# Patient Record
Sex: Male | Born: 1965 | Race: Black or African American | Hispanic: No | Smoking: Never smoker
Health system: Southern US, Community
[De-identification: ages and names within clinical notes are randomized; demographics above are authoritative.]

## PROBLEM LIST (undated history)

## (undated) DIAGNOSIS — F329 Major depressive disorder, single episode, unspecified: Secondary | ICD-10-CM

## (undated) DIAGNOSIS — E559 Vitamin D deficiency, unspecified: Secondary | ICD-10-CM

## (undated) DIAGNOSIS — F429 Obsessive-compulsive disorder, unspecified: Secondary | ICD-10-CM

## (undated) DIAGNOSIS — E291 Testicular hypofunction: Secondary | ICD-10-CM

## (undated) DIAGNOSIS — F32A Depression, unspecified: Secondary | ICD-10-CM

## (undated) DIAGNOSIS — E119 Type 2 diabetes mellitus without complications: Secondary | ICD-10-CM

## (undated) HISTORY — DX: Depression, unspecified: F32.A

## (undated) HISTORY — DX: Vitamin D deficiency, unspecified: E55.9

## (undated) HISTORY — DX: Type 2 diabetes mellitus without complications: E11.9

## (undated) HISTORY — DX: Testicular hypofunction: E29.1

## (undated) HISTORY — DX: Major depressive disorder, single episode, unspecified: F32.9

## (undated) HISTORY — DX: Obsessive-compulsive disorder, unspecified: F42.9

---

## 1998-08-27 ENCOUNTER — Other Ambulatory Visit: Admission: RE | Admit: 1998-08-27 | Discharge: 1998-08-27 | Payer: Self-pay | Admitting: Orthopaedic Surgery

## 2004-04-14 ENCOUNTER — Ambulatory Visit (HOSPITAL_COMMUNITY): Admission: RE | Admit: 2004-04-14 | Discharge: 2004-04-14 | Payer: Self-pay | Admitting: *Deleted

## 2011-06-29 ENCOUNTER — Encounter: Payer: Self-pay | Admitting: *Deleted

## 2011-08-22 ENCOUNTER — Ambulatory Visit (INDEPENDENT_AMBULATORY_CARE_PROVIDER_SITE_OTHER): Payer: 59 | Admitting: Family Medicine

## 2011-08-22 VITALS — BP 135/72 | HR 67 | Temp 98.2°F | Resp 16 | Ht 73.5 in | Wt 280.2 lb

## 2011-08-22 DIAGNOSIS — M25561 Pain in right knee: Secondary | ICD-10-CM

## 2011-08-22 DIAGNOSIS — M7050 Other bursitis of knee, unspecified knee: Secondary | ICD-10-CM

## 2011-08-22 DIAGNOSIS — M76899 Other specified enthesopathies of unspecified lower limb, excluding foot: Secondary | ICD-10-CM

## 2011-08-22 DIAGNOSIS — M25569 Pain in unspecified knee: Secondary | ICD-10-CM

## 2011-08-22 NOTE — Patient Instructions (Signed)
Knee Pain The knee is the complex joint between your thigh and your lower leg. It is made up of bones, tendons, ligaments, and cartilage. The bones that make up the knee are:  The femur in the thigh.   The tibia and fibula in the lower leg.   The patella or kneecap riding in the groove on the lower femur.  CAUSES  Knee pain is a common complaint with many causes. A few of these causes are:  Injury, such as:   A ruptured ligament or tendon injury.   Torn cartilage.   Medical conditions, such as:   Gout   Arthritis   Infections   Overuse, over training or overdoing a physical activity.  Knee pain can be minor or severe. Knee pain can accompany debilitating injury. Minor knee problems often respond well to self-care measures or get well on their own. More serious injuries may need medical intervention or even surgery. SYMPTOMS The knee is complex. Symptoms of knee problems can vary widely. Some of the problems are:  Pain with movement and weight bearing.   Swelling and tenderness.   Buckling of the knee.   Inability to straighten or extend your knee.   Your knee locks and you cannot straighten it.   Warmth and redness with pain and fever.   Deformity or dislocation of the kneecap.  DIAGNOSIS  Determining what is wrong may be very straight forward such as when there is an injury. It can also be challenging because of the complexity of the knee. Tests to make a diagnosis may include:  Your caregiver taking a history and doing a physical exam.   Routine X-rays can be used to rule out other problems. X-rays will not reveal a cartilage tear. Some injuries of the knee can be diagnosed by:   Arthroscopy a surgical technique by which a small video camera is inserted through tiny incisions on the sides of the knee. This procedure is used to examine and repair internal knee joint problems. Tiny instruments can be used during arthroscopy to repair the torn knee cartilage  (meniscus).   Arthrography is a radiology technique. A contrast liquid is directly injected into the knee joint. Internal structures of the knee joint then become visible on X-ray film.   An MRI scan is a non x-ray radiology procedure in which magnetic fields and a computer produce two- or three-dimensional images of the inside of the knee. Cartilage tears are often visible using an MRI scanner. MRI scans have largely replaced arthrography in diagnosing cartilage tears of the knee.   Blood work.   Examination of the fluid that helps to lubricate the knee joint (synovial fluid). This is done by taking a sample out using a needle and a syringe.  TREATMENT The treatment of knee problems depends on the cause. Some of these treatments are:  Depending on the injury, proper casting, splinting, surgery or physical therapy care will be needed.   Give yourself adequate recovery time. Do not overuse your joints. If you begin to get sore during workout routines, back off. Slow down or do fewer repetitions.   For repetitive activities such as cycling or running, maintain your strength and nutrition.   Alternate muscle groups. For example if you are a weight lifter, work the upper body on one day and the lower body the next.   Either tight or weak muscles do not give the proper support for your knee. Tight or weak muscles do not absorb the stress placed   on the knee joint. Keep the muscles surrounding the knee strong.   Take care of mechanical problems.   If you have flat feet, orthotics or special shoes may help. See your caregiver if you need help.   Arch supports, sometimes with wedges on the inner or outer aspect of the heel, can help. These can shift pressure away from the side of the knee most bothered by osteoarthritis.   A brace called an "unloader" brace also may be used to help ease the pressure on the most arthritic side of the knee.   If your caregiver has prescribed crutches, braces,  wraps or ice, use as directed. The acronym for this is PRICE. This means protection, rest, ice, compression and elevation.   Nonsteroidal anti-inflammatory drugs (NSAID's), can help relieve pain. But if taken immediately after an injury, they may actually increase swelling. Take NSAID's with food in your stomach. Stop them if you develop stomach problems. Do not take these if you have a history of ulcers, stomach pain or bleeding from the bowel. Do not take without your caregiver's approval if you have problems with fluid retention, heart failure, or kidney problems.   For ongoing knee problems, physical therapy may be helpful.   Glucosamine and chondroitin are over-the-counter dietary supplements. Both may help relieve the pain of osteoarthritis in the knee. These medicines are different from the usual anti-inflammatory drugs. Glucosamine may decrease the rate of cartilage destruction.   Injections of a corticosteroid drug into your knee joint may help reduce the symptoms of an arthritis flare-up. They may provide pain relief that lasts a few months. You may have to wait a few months between injections. The injections do have a small increased risk of infection, water retention and elevated blood sugar levels.   Hyaluronic acid injected into damaged joints may ease pain and provide lubrication. These injections may work by reducing inflammation. A series of shots may give relief for as long as 6 months.   Topical painkillers. Applying certain ointments to your skin may help relieve the pain and stiffness of osteoarthritis. Ask your pharmacist for suggestions. Many over the-counter products are approved for temporary relief of arthritis pain.   In some countries, doctors often prescribe topical NSAID's for relief of chronic conditions such as arthritis and tendinitis. A review of treatment with NSAID creams found that they worked as well as oral medications but without the serious side effects.    PREVENTION  Maintain a healthy weight. Extra pounds put more strain on your joints.   Get strong, stay limber. Weak muscles are a common cause of knee injuries. Stretching is important. Include flexibility exercises in your workouts.   Be smart about exercise. If you have osteoarthritis, chronic knee pain or recurring injuries, you may need to change the way you exercise. This does not mean you have to stop being active. If your knees ache after jogging or playing basketball, consider switching to swimming, water aerobics or other low-impact activities, at least for a few days a week. Sometimes limiting high-impact activities will provide relief.   Make sure your shoes fit well. Choose footwear that is right for your sport.   Protect your knees. Use the proper gear for knee-sensitive activities. Use kneepads when playing volleyball or laying carpet. Buckle your seat belt every time you drive. Most shattered kneecaps occur in car accidents.   Rest when you are tired.  SEEK MEDICAL CARE IF:  You have knee pain that is continual and does not   seem to be getting better.  SEEK IMMEDIATE MEDICAL CARE IF:  Your knee joint feels hot to the touch and you have a high fever. MAKE SURE YOU:   Understand these instructions.   Will watch your condition.   Will get help right away if you are not doing well or get worse.  Document Released: 01/02/2007 Document Revised: 02/24/2011 Document Reviewed: 01/02/2007 [employer] Patient Information 2012 [employer]. 

## 2011-08-22 NOTE — Progress Notes (Deleted)
  Subjective:    Patient ID: Tyrone Holloway, male    DOB: 10-Aug-1965, 46 y.o.   MRN: [id number]  HPI    Review of Systems     Objective:   Physical Exam        Assessment & Plan:

## 2011-08-22 NOTE — Progress Notes (Signed)
  Subjective:    Patient ID: Tyrone Holloway, male    DOB: 09/24/1965, 46 y.o.   MRN: [id number]  HPI R knee pain x 1 week Prior hx/o recurrent knee bursitis s/p bursectomy with [facility] 7 years ago per pt.  Has had minimal knee pain since this point apart from a few flares.  Works on [employer] daily.  Has had R knee pain and swelling.  No direct trauma per pt. No knee locking or giving away.  Swelling predominantly in anterior knee.   Review of Systems See HPI, otherwise ROS negative    Objective:   Physical Exam Gen: in bed, NAD MSK: Knee: Normal to inspection with no erythema or obvious bony abnormalities. + mild anterior knee effusion Palpation normal with no warmth or joint line tenderness or patellar tenderness or condyle tenderness. ROM normal in flexion and extension and lower leg rotation. Ligaments with solid consistent endpoints including ACL, PCL, LCL, MCL. Negative Mcmurray's and provocative meniscal tests. Non painful patellar compression. Patellar and quadriceps tendons unremarkable. Hamstring and quadriceps strength is normal.        Assessment & Plan:  Knee pain:   After consent was obtained, using sterile technique the R knee was prepped, and  via the medial infrapatellar approach, joint aspiration was attempted, however was unable to aspirate any fluid from joint space. 40mg  of kenalog and 4 ccs of 1% Lidocaine was then injected and the needle withdrawn.  The procedure was well tolerated.   The patient is asked to continue to rest the knee for a few more days before resuming regular activities.  It may be more painful for the first 1-2 days.  Watch for fever, or increased swelling or persistent pain in knee. Call or return to clinic prn if such symptoms occur or the knee fails to improve as anticipated.     The patient and/or caregiver has been counseled thoroughly with regard to treatment plan and/or medications prescribed including dosage, schedule,  interactions, rationale for use, and possible side effects and they verbalize understanding. Diagnoses and expected course of recovery discussed and will return if not improved as expected or if the condition worsens. Patient and/or caregiver verbalized understanding.     Addendum-I agree with the exam and plan for this chronic osteoarthritis with effusion and pain/we will refer him back to [facility] for followup evaluation Unless he responds dramatically to this injection. [name]

## 2012-02-23 ENCOUNTER — Telehealth: Payer: Self-pay

## 2012-02-23 NOTE — Telephone Encounter (Signed)
PT STATES HE [facility] PLEASE CALL [phone] OR [phone] PT WAS TOLD IT COULD TAKE UP TO 72 HRS IF NOT MORE

## 2012-02-24 NOTE — Telephone Encounter (Signed)
Records ready for pickup. Patient notified. °

## 2012-03-16 ENCOUNTER — Ambulatory Visit (INDEPENDENT_AMBULATORY_CARE_PROVIDER_SITE_OTHER): Payer: 59 | Admitting: Emergency Medicine

## 2012-03-16 VITALS — BP 140/77 | HR 75 | Temp 97.6°F | Resp 16 | Ht 73.5 in | Wt 295.0 lb

## 2012-03-16 DIAGNOSIS — Z0000 Encounter for general adult medical examination without abnormal findings: Secondary | ICD-10-CM

## 2012-03-16 DIAGNOSIS — Z113 Encounter for screening for infections with a predominantly sexual mode of transmission: Secondary | ICD-10-CM

## 2012-03-16 LAB — POCT URINALYSIS DIPSTICK
Bilirubin, UA: NEGATIVE
Blood, UA: NEGATIVE
Glucose, UA: NEGATIVE
Leukocytes, UA: NEGATIVE
Nitrite, UA: NEGATIVE
Urobilinogen, UA: 0.2

## 2012-03-16 LAB — COMPREHENSIVE METABOLIC PANEL
ALT: 33 U/L (ref 0–53)
AST: 19 U/L (ref 0–37)
Albumin: 4.3 g/dL (ref 3.5–5.2)
Alkaline Phosphatase: 45 U/L (ref 39–117)
BUN: 11 mg/dL (ref 6–23)
Calcium: 9.3 mg/dL (ref 8.4–10.5)
Chloride: 105 mEq/L (ref 96–112)
Creat: 0.94 mg/dL (ref 0.50–1.35)
Potassium: 4.4 mEq/L (ref 3.5–5.3)

## 2012-03-16 LAB — POCT UA - MICROSCOPIC ONLY
Casts, Ur, LPF, POC: NEGATIVE
Yeast, UA: NEGATIVE

## 2012-03-16 LAB — POCT CBC
Lymph, poc: 2.3 (ref 0.6–3.4)
MCH, POC: 26.8 pg — AB (ref 27–31.2)
MCHC: 30.8 g/dL — AB (ref 31.8–35.4)
MID (cbc): 0.4 (ref 0–0.9)
MPV: 8.1 fL (ref 0–99.8)
POC Granulocyte: 2.6 (ref 2–6.9)
POC LYMPH PERCENT: 43.6 %L (ref 10–50)
POC MID %: 8.2 %M (ref 0–12)
Platelet Count, POC: 410 10*3/uL (ref 142–424)
RDW, POC: 14.4 %
WBC: 5.3 10*3/uL (ref 4.6–10.2)

## 2012-03-16 LAB — LIPID PANEL
HDL: 32 mg/dL — ABNORMAL LOW (ref >39)
LDL Cholesterol: 143 mg/dL — ABNORMAL HIGH (ref 0–99)
Total CHOL/HDL Ratio: 6.1 Ratio

## 2012-03-16 LAB — TSH: TSH: 1.653 u[IU]/mL (ref 0.350–4.500)

## 2012-03-16 LAB — POCT GLYCOSYLATED HEMOGLOBIN (HGB A1C): Hemoglobin A1C: 6.5

## 2012-03-16 NOTE — Progress Notes (Signed)
[facility] [address] [place] [address] [phone]  Date:  03/16/2012   Name:  Tyrone Holloway   DOB:  Aug 04, 1965   MRN:  [id number]  PCP:  No primary provider on file.    Chief Complaint: Annual Exam   History of Present Illness:  Tyrone Holloway is a 46 y.o. very pleasant male patient who presents with the following:  For wellness examination.  No acute medical concerns.  Had a flu shot at [facility]  Diagnosed with "borderline diabetes" but not taking medication.  Has chronic low back pain.  Not currently on medications. Scheduled for colonoscopy in March at [facility].    There is no problem list on file for this patient.   Past Medical History  Diagnosis Date  . Depression   . OCD (obsessive compulsive disorder)     No past surgical history on file.  History  Substance Use Topics  . Smoking status: Never Smoker   . Smokeless tobacco: Not on file  . Alcohol Use: No    Family History  Problem Relation Age of Onset  . Hypertension Mother   . Hyperlipidemia Mother   . Hypertension Father   . Diabetes Father   . Hypertension Brother   . Diabetes Brother   . Cancer Maternal Grandfather     No Known Allergies  Medication list has been reviewed and updated.  Current Outpatient Prescriptions on File Prior to Visit  Medication Sig Dispense Refill  . HYDROcodone-acetaminophen (NORCO) 10-325 MG per tablet Take 1 tablet by mouth every 6 (six) hours as needed.        Review of Systems:  As per HPI, otherwise negative.    Physical Examination: Filed Vitals:   03/16/12 1417  BP: 140/77  Pulse: 75  Temp: 97.6 F (36.4 C)  Resp: 16   Filed Vitals:   03/16/12 1417  Height: 6' 1.5" (1.867 m)  Weight: 295 lb (133.811 kg)   Body mass index is 38.39 kg/(m^2). Ideal Body Weight: Weight in (lb) to have BMI = 25: 191.7   GEN: WDWN, NAD, Non-toxic, A & O x 3  No rash or icterus HEENT: Atraumatic, Normocephalic. Neck supple. No  masses, No LAD. Oropharynx negative Ears and Nose: No external deformity.  TM negative. CV: RRR, No M/G/R. No JVD. No thrill. No extra heart sounds. PULM: CTA B, no wheezes, crackles, rhonchi. No retractions. No resp. distress. No accessory muscle use. Back:  Large midback sebaceous cyst ABD: S, NT, ND, +BS. No rebound. No HSM.  Nonpigmented striae EXTR: No c/c/e NEURO Normal gait.  PSYCH: Normally interactive. Conversant. Not depressed or anxious appearing.  Calm demeanor.  RECTAL:  Patient declines as he is having a colonoscopy in March.  Assessment and Plan: Wellness exam Obesity NIDDM Labs pending Follow up based on labs  Carmelina Dane, MD

## 2012-03-17 LAB — VITAMIN D 25 HYDROXY (VIT D DEFICIENCY, FRACTURES): Vit D, 25-Hydroxy: 27 ng/mL — ABNORMAL LOW (ref 30–89)

## 2012-03-17 NOTE — Progress Notes (Signed)
Reviewed and agree.

## 2012-03-19 ENCOUNTER — Telehealth: Payer: Self-pay

## 2012-03-19 LAB — GC/CHLAMYDIA PROBE AMP, URINE: GC Probe Amp, Urine: NEGATIVE

## 2012-03-19 NOTE — Telephone Encounter (Signed)
Pt would like to have a copy of his lab work that was done during his last visit here. Best# [phone]

## 2012-03-20 NOTE — Telephone Encounter (Signed)
patient came by and picked up

## 2012-03-20 NOTE — Telephone Encounter (Signed)
Called patient, does he want it mailed? Or does he want to pick this up?

## 2012-04-12 ENCOUNTER — Ambulatory Visit (INDEPENDENT_AMBULATORY_CARE_PROVIDER_SITE_OTHER): Payer: 59 | Admitting: Family Medicine

## 2012-04-12 VITALS — BP 139/80 | HR 102 | Temp 98.5°F | Resp 20 | Ht 73.5 in | Wt 288.2 lb

## 2012-04-12 DIAGNOSIS — R110 Nausea: Secondary | ICD-10-CM

## 2012-04-12 DIAGNOSIS — R197 Diarrhea, unspecified: Secondary | ICD-10-CM

## 2012-04-12 DIAGNOSIS — R7302 Impaired glucose tolerance (oral): Secondary | ICD-10-CM

## 2012-04-12 DIAGNOSIS — R112 Nausea with vomiting, unspecified: Secondary | ICD-10-CM

## 2012-04-12 DIAGNOSIS — R5383 Other fatigue: Secondary | ICD-10-CM

## 2012-04-12 LAB — POCT CBC
HCT, POC: 49.2 % (ref 43.5–53.7)
Hemoglobin: 15.5 g/dL (ref 14.1–18.1)
Lymph, poc: 0.6 (ref 0.6–3.4)
MCH, POC: 27.1 pg (ref 27–31.2)
MCHC: 31.5 g/dL — AB (ref 31.8–35.4)
POC LYMPH PERCENT: 7.5 %L — AB (ref 10–50)
RDW, POC: 14.2 %
WBC: 8.5 10*3/uL (ref 4.6–10.2)

## 2012-04-12 MED ORDER — ONDANSETRON HCL 8 MG PO TABS: 8.0000 mg | ORAL_TABLET | Freq: Three times a day (TID) | ORAL | Status: DC | PRN

## 2012-04-12 MED ORDER — ONDANSETRON 4 MG PO TBDP: 8.0000 mg | ORAL_TABLET | Freq: Once | ORAL | Status: DC

## 2012-04-12 NOTE — Patient Instructions (Addendum)
It seems that you have a stomach virus, or perhaps are having a reaction to the salad that you ate.  Rest and drink plenty of fluids.  If you are not feeling better by tomorrow or are getting worse, or if you develop any abdominal pain please let us know.

## 2012-04-12 NOTE — Progress Notes (Signed)
[facility] [address] [place] [address] [phone]  Date:  04/12/2012   Name:  Tyrone Holloway   DOB:  Dec 04, 1965   MRN:  [id number]  PCP:  No primary provider on file.    Chief Complaint: Dizziness, Diarrhea and Fatigue   History of Present Illness:  Tyrone Holloway is a 47 y.o. very pleasant male patient who presents with the following:  He is here today with illness.  He feels lightheaded, weak, "stomach is sour," and he has had an episode of diarrhea this am followed by a couple of solid stools.   No vomiting but he has felt nauseated.  He did eat a banana and a yogurt this am.   He does not have any abdominal pain.  He ate a salad at [employer] last night.  The cheese tasted bad to him but he ate it anyway.    He is generally healthy.   There is no problem list on file for this patient.   Past Medical History  Diagnosis Date  . Depression   . OCD (obsessive compulsive disorder)     No past surgical history on file.  History  Substance Use Topics  . Smoking status: Never Smoker   . Smokeless tobacco: Not on file  . Alcohol Use: No    Family History  Problem Relation Age of Onset  . Hypertension Mother   . Hyperlipidemia Mother   . Hypertension Father   . Diabetes Father   . Hypertension Brother   . Diabetes Brother   . Cancer Maternal Grandfather     No Known Allergies  Medication list has been reviewed and updated.  Current Outpatient Prescriptions on File Prior to Visit  Medication Sig Dispense Refill  . HYDROcodone-acetaminophen (NORCO) 10-325 MG per tablet Take 1 tablet by mouth every 6 (six) hours as needed.        Review of Systems:  As per HPI- otherwise negative.   Physical Examination: Filed Vitals:   04/12/12 1514  BP: 139/80  Pulse: 102  Temp: 98.5 F (36.9 C)  Resp: 20   Filed Vitals:   04/12/12 1514  Height: 6' 1.5" (1.867 m)  Weight: 288 lb 3.2 oz (130.727 kg)   Body mass index is  37.51 kg/(m^2). Ideal Body Weight: Weight in (lb) to have BMI = 25: 191.7   GEN: WDWN, NAD, Non-toxic, A & O x 3, obese HEENT: Atraumatic, Normocephalic. Neck supple. No masses, No LAD. Bilateral TM wnl, oropharynx normal.  PEERL,EOMI.   Ears and Nose: No external deformity. CV: RRR, No M/G/R. No JVD. No thrill. No extra heart sounds. PULM: CTA B, no wheezes, crackles, rhonchi. No retractions. No resp. distress. No accessory muscle use. ABD: S, NT, ND, +BS. No rebound. No HSM. EXTR: No c/c/e NEURO Normal gait.  PSYCH: Normally interactive. Conversant. Not depressed or anxious appearing.  Calm demeanor.   Results for orders placed in visit on 04/12/12  POCT CBC      Component Value Range   WBC 8.5  4.6 - 10.2 K/uL   Lymph, poc 0.6  0.6 - 3.4   POC LYMPH PERCENT 7.5 (*) 10 - 50 %L   MID (cbc) 0.3  0 - 0.9   POC MID % 3.4  0 - 12 %M   POC Granulocyte 7.6 (*) 2 - 6.9   Granulocyte percent 89.1 (*) 37 - 80 %G   RBC 5.71  4.69 - 6.13 M/uL   Hemoglobin  15.5  14.1 - 18.1 g/dL   HCT, POC 16.1  09.6 - 53.7 %   MCV 86.2  80 - 97 fL   MCH, POC 27.1  27 - 31.2 pg   MCHC 31.5 (*) 31.8 - 35.4 g/dL   RDW, POC 04.5     Platelet Count, POC 404  142 - 424 K/uL   MPV 7.6  0 - 99.8 fL  GLUCOSE, POCT (MANUAL RESULT ENTRY)      Component Value Range   POC Glucose 129 (*) 70 - 99 mg/dl  POCT INFLUENZA A/B      Component Value Range   Influenza A, POC Negative     Influenza B, POC Negative     Given 8mg  of zofran ODT.  He felt better and drank gatorade, ate crackers.    Assessment and Plan: 1. Diarrhea  POCT CBC  2. Nausea  ondansetron (ZOFRAN-ODT) disintegrating tablet 8 mg, ondansetron (ZOFRAN) 8 MG tablet  3. Impaired glucose tolerance  POCT glucose (manual entry)  4. Fatigue  POCT Influenza A/B   Renly likely has a viral gastroenteritis vs reaction to eating a spoiled food.   He will rest and use zofran as needed.  cautioned him regarding he need for close follow-up if not better or if  getting worse, especially if he does develop abdominal pain Jodie Cavey, MD

## 2013-03-18 ENCOUNTER — Ambulatory Visit (INDEPENDENT_AMBULATORY_CARE_PROVIDER_SITE_OTHER): Payer: 59 | Admitting: Physician Assistant

## 2013-03-18 VITALS — BP 132/78 | HR 71 | Temp 98.7°F | Resp 14 | Ht 74.0 in | Wt 293.0 lb

## 2013-03-18 DIAGNOSIS — Z0000 Encounter for general adult medical examination without abnormal findings: Secondary | ICD-10-CM

## 2013-03-18 DIAGNOSIS — Z7251 High risk heterosexual behavior: Secondary | ICD-10-CM

## 2013-03-18 DIAGNOSIS — R5381 Other malaise: Secondary | ICD-10-CM

## 2013-03-18 DIAGNOSIS — E119 Type 2 diabetes mellitus without complications: Secondary | ICD-10-CM

## 2013-03-18 LAB — POCT URINALYSIS DIPSTICK
Bilirubin, UA: NEGATIVE
Blood, UA: NEGATIVE
Nitrite, UA: NEGATIVE
Spec Grav, UA: 1.015
Urobilinogen, UA: 0.2
pH, UA: 5.5

## 2013-03-18 LAB — POCT UA - MICROSCOPIC ONLY
Bacteria, U Microscopic: NEGATIVE
Casts, Ur, LPF, POC: NEGATIVE
Crystals, Ur, HPF, POC: NEGATIVE
RBC, urine, microscopic: NEGATIVE
Yeast, UA: NEGATIVE

## 2013-03-18 LAB — POCT CBC
Hemoglobin: 12.6 g/dL — AB (ref 14.1–18.1)
Lymph, poc: 1.8 (ref 0.6–3.4)
MCH, POC: 27.2 pg (ref 27–31.2)
MCHC: 31 g/dL — AB (ref 31.8–35.4)
MID (cbc): 0.4 (ref 0–0.9)
MPV: 7.9 fL (ref 0–99.8)
POC Granulocyte: 3.1 (ref 2–6.9)
POC LYMPH PERCENT: 33.5 %L (ref 10–50)
POC MID %: 7.1 %M (ref 0–12)
Platelet Count, POC: 303 10*3/uL (ref 142–424)
RDW, POC: 14.2 %
WBC: 5.3 10*3/uL (ref 4.6–10.2)

## 2013-03-18 MED ORDER — GLUCOSE BLOOD VI STRP: ORAL_STRIP | Status: DC

## 2013-03-18 MED ORDER — METFORMIN HCL 500 MG PO TABS: 500.0000 mg | ORAL_TABLET | Freq: Two times a day (BID) | ORAL | Status: DC

## 2013-03-18 MED ORDER — FREESTYLE LANCETS MISC
Status: DC
Start: 2013-03-18 — End: 2013-03-19

## 2013-03-18 NOTE — Progress Notes (Signed)
Patient ID: Tyrone Holloway MRN: [id number], DOB: [birth year] 47 y.o. Date of Encounter: 03/18/2013, 5:08 PM  Primary Physician: No primary provider on file.  Chief Complaint: Physical (CPE)  HPI: 47 y.o. male with history noted below here for CPE. Doing well. Last physical was Nov-Dec 2013.   1) STD check: He would like to be checked for STD's. States he recently had unprotected sex with a new male partner within the past several days. He has had unprotected intercourse with 3-4 females within the past 12 months. He denies any dysuria, discharge, lesions, blisters, or sores.   2) Diabetes mellitus: He was started on metformin about a year ago. He is uncertain of the dosage. However, he has been out of the medication since May 2014. He was tolerating the medication without adverse effects. This was written for him by Dr. Milinda Cave. He does not remember what his blood sugar or A1C were at their office. He has not checked his blood sugar at home recently because he is out of test strips. He diet consists of some easy foods and juices. He would like to start working out and lose some weight. He knows this would help. He drinks a lot of water throughout the day. Because of this he is up urinating frequently.   3) CPE: He was in the [place] [employer] in the early 1990's. He gets some of his healthcare through the [facility] medical system. He received a screening colonoscopy 2 years ago secondary to family history, his maternal grandfather had colon cancer. He reports this screening as normal. I do not have the report in front of me tonight. He does not know when he was advised to return for follow up. He states "they will contact me." He is asymptomatic. His maternal uncle had prostate cancer. He has under gone prostate/DRE and PSA screens since his 30's. He reports being allergic to "something" but no one has been able to determine the substance. He has undergone allergy testing and this was inconclusive. He would  like to have his testosterone checked. He states this has been low in the past and would lust like to have it checked again.   Review of Systems: Consitutional: Positive for fatigue. No fever, chills, night sweats, lymphadenopathy, or weight changes. Eyes: No visual changes, eye redness, or discharge. ENT/Mouth: Ears: No otalgia, tinnitus, hearing loss, discharge. Nose: No congestion, rhinorrhea, sinus pain, or epistaxis. Throat: No sore throat, post nasal drip, or teeth pain. Cardiovascular: No CP, palpitations, diaphoresis, DOE, edema, orthopnea, PND. Respiratory: No cough, hemoptysis, SOB, or wheezing. Gastrointestinal: No anorexia, dysphagia, reflux, pain, nausea, vomiting, hematemesis, diarrhea, constipation, BRBPR, or melena. Genitourinary: Positive for frequency. No dysuria, urgency, hematuria, incontinence, nocturia, decreased urinary stream, discharge, impotence, or testicular pain/masses. Musculoskeletal: No decreased ROM, myalgias, stiffness, joint swelling, or weakness. Skin: No rash, erythema, lesion changes, pain, warmth, jaundice, or pruritis. Neurological: No headache, dizziness, syncope, seizures, tremors, memory loss, coordination problems, or paresthesias. Psychological: No anxiety, depression, hallucinations, SI/HI. Endocrine: See above.   Past Medical History  Diagnosis Date  . Depression   . OCD (obsessive compulsive disorder)      History reviewed. No pertinent past surgical history.  Home Meds:  Prior to Admission medications   Not on File    Allergies: No Known Allergies  History   Social History  . Marital Status: Divorced    Spouse Name: N/A    Number of Children: N/A  . Years of Education: N/A   Occupational History  .  Not on file.   Social History Main Topics  . Smoking status: Never Smoker   . Smokeless tobacco: Not on file  . Alcohol Use: No  . Drug Use: No  . Sexual Activity: No   Other Topics Concern  . Not on file   Social History  Narrative  . No narrative on file    Family History  Problem Relation Age of Onset  . Hypertension Mother   . Hyperlipidemia Mother   . Hypertension Father   . Diabetes Father   . Hypertension Brother   . Diabetes Brother   . Cancer Maternal Grandfather     Physical Exam: Blood pressure 132/78, pulse 71, temperature 98.7 F (37.1 C), resp. rate 14, height 6\' 2"  (1.88 m), weight 293 lb (132.904 kg), SpO2 100.00%.  General: Well developed, well nourished, in no acute distress. HEENT: Normocephalic, atraumatic. Conjunctiva pink, sclera non-icteric. Pupils 2 mm constricting to 1 mm, round, regular, and equally reactive to light and accomodation. EOMI. Internal auditory canal clear. TMs with good cone of light and without pathology. Nasal mucosa pink. Nares are without discharge. No sinus tenderness. Oral mucosa pink. Dentition normal. Pharynx without exudate.   Neck: Supple. Trachea midline. No thyromegaly. Full ROM. No lymphadenopathy. Lungs: Clear to auscultation bilaterally without wheezes, rales, or rhonchi. Breathing is of normal effort and unlabored. Cardiovascular: RRR with S1 S2. No murmurs, rubs, or gallops appreciated. Distal pulses 2+ symmetrically. No carotid or abdominal bruits. Abdomen: Soft, non-tender, non-distended with normoactive bowel sounds. No hepatosplenomegaly or masses. No rebound/guarding. No CVA tenderness. Without hernias.  Rectal: No external hemorrhoids or fissures. Rectal vault without masses. Prostate not enlarged, smooth, symmetrical, without nodules, or TTP.  Genitourinary: Uncircumcised male. No penile lesions. Testes descended bilaterally, and smooth without tenderness or masses.  Musculoskeletal: Full range of motion and 5/5 strength throughout. Without swelling, atrophy, tenderness, crepitus, or warmth. Extremities without clubbing, cyanosis, or edema. Calves supple. Skin: Warm and moist without erythema, ecchymosis, wounds, or rash. Neuro: A+Ox3. CN  II-XII grossly intact. Moves all extremities spontaneously. Full sensation throughout. Normal gait. DTR 2+ throughout upper and lower extremities. Finger to nose intact. Psych:  Responds to questions appropriately with a normal affect.   Studies:  Results for orders placed in visit on 03/18/13  POCT CBC      Result Value Range   WBC 5.3  4.6 - 10.2 K/uL   Lymph, poc 1.8  0.6 - 3.4   POC LYMPH PERCENT 33.5  10 - 50 %L   MID (cbc) 0.4  0 - 0.9   POC MID % 7.1  0 - 12 %M   POC Granulocyte 3.1  2 - 6.9   Granulocyte percent 59.4  37 - 80 %G   RBC 4.64 (*) 4.69 - 6.13 M/uL   Hemoglobin 12.6 (*) 14.1 - 18.1 g/dL   HCT, POC 16.1 (*) 09.6 - 53.7 %   MCV 87.6  80 - 97 fL   MCH, POC 27.2  27 - 31.2 pg   MCHC 31.0 (*) 31.8 - 35.4 g/dL   RDW, POC 04.5     Platelet Count, POC 303  142 - 424 K/uL   MPV 7.9  0 - 99.8 fL  GLUCOSE, POCT (MANUAL RESULT ENTRY)      Result Value Range   POC Glucose 93  70 - 99 mg/dl  POCT GLYCOSYLATED HEMOGLOBIN (HGB A1C)      Result Value Range   Hemoglobin A1C 6.2    POCT URINALYSIS  DIPSTICK      Result Value Range   Color, UA yellow     Clarity, UA clear     Glucose, UA neg     Bilirubin, UA neg     Ketones, UA neg     Spec Grav, UA 1.015     Blood, UA neg     pH, UA 5.5     Protein, UA neg     Urobilinogen, UA 0.2     Nitrite, UA neg     Leukocytes, UA Negative    POCT UA - MICROSCOPIC ONLY      Result Value Range   WBC, Ur, HPF, POC neg     RBC, urine, microscopic neg     Bacteria, U Microscopic neg     Mucus, UA neg     Epithelial cells, urine per micros 0-1     Crystals, Ur, HPF, POC neg     Casts, Ur, LPF, POC neg     Yeast, UA neg      CBC, CMET, Lipid, PSA, TSH all pending. Patient is not fasting. Future order for testosterone placed.   Assessment/Plan:  47 y.o. male here for CPE with high risk sex, diabetes mellitus, and newly found anemia.   1) Anemia -Recheck in 3 months -Healthy diet and exercise  2) Diabetes  mellitus -Restart metformin 500 mg 1 po bid #60 RF 2 -Healthy diet and exercise -Weight loss -See ophthalmologist -See DDS -Recheck 3 months  3) High risk sex -Use condoms -Safe sex practices -Await labs, treat if needed  4) CPE -Healthy diet and exercise -Weight loss -Age appropriate anticipatory guidance   Signed, Eula Listen, PA-C [facility] [place], [place] [address] [phone] 03/18/2013 5:08 PM

## 2013-03-19 ENCOUNTER — Telehealth: Payer: Self-pay

## 2013-03-19 DIAGNOSIS — E119 Type 2 diabetes mellitus without complications: Secondary | ICD-10-CM

## 2013-03-19 LAB — COMPREHENSIVE METABOLIC PANEL
ALT: 29 U/L (ref 0–53)
Albumin: 4.5 g/dL (ref 3.5–5.2)
BUN: 10 mg/dL (ref 6–23)
CO2: 25 mEq/L (ref 19–32)
Calcium: 9.7 mg/dL (ref 8.4–10.5)
Chloride: 103 mEq/L (ref 96–112)
Glucose, Bld: 100 mg/dL — ABNORMAL HIGH (ref 70–99)
Potassium: 4.3 mEq/L (ref 3.5–5.3)
Total Bilirubin: 0.4 mg/dL (ref 0.3–1.2)
Total Protein: 7.5 g/dL (ref 6.0–8.3)

## 2013-03-19 LAB — RPR

## 2013-03-19 LAB — TSH: TSH: 2.214 u[IU]/mL (ref 0.350–4.500)

## 2013-03-19 LAB — LIPID PANEL
Cholesterol: 220 mg/dL — ABNORMAL HIGH (ref 0–200)
LDL Cholesterol: 152 mg/dL — ABNORMAL HIGH (ref 0–99)
Triglycerides: 166 mg/dL — ABNORMAL HIGH (ref <150)

## 2013-03-19 LAB — HIV ANTIBODY (ROUTINE TESTING W REFLEX): HIV: NONREACTIVE

## 2013-03-19 MED ORDER — GLUCOSE BLOOD VI STRP: ORAL_STRIP | Status: DC

## 2013-03-19 MED ORDER — FREESTYLE LANCETS MISC: Status: AC

## 2013-03-19 NOTE — Telephone Encounter (Signed)
Faxed req for specific instr's for pt's lancets. Sig can not be "use as directed". Re-sending in for daily testing since pt is not on insulin.

## 2013-03-20 ENCOUNTER — Other Ambulatory Visit: Payer: Self-pay | Admitting: Physician Assistant

## 2013-03-20 DIAGNOSIS — E785 Hyperlipidemia, unspecified: Secondary | ICD-10-CM

## 2013-03-20 LAB — GC/CHLAMYDIA PROBE AMP
CT Probe RNA: NEGATIVE
GC Probe RNA: NEGATIVE

## 2013-03-20 LAB — HSV(HERPES SIMPLEX VRS) I + II AB-IGG: HSV 1 Glycoprotein G Ab, IgG: <0.1 IV

## 2013-03-20 MED ORDER — ATORVASTATIN CALCIUM 20 MG PO TABS: 20.0000 mg | ORAL_TABLET | Freq: Every day | ORAL | Status: DC

## 2013-03-22 ENCOUNTER — Encounter: Payer: Self-pay | Admitting: Radiology

## 2013-04-01 ENCOUNTER — Encounter: Payer: Self-pay | Admitting: *Deleted

## 2013-04-01 DIAGNOSIS — E559 Vitamin D deficiency, unspecified: Secondary | ICD-10-CM | POA: Insufficient documentation

## 2013-04-03 ENCOUNTER — Ambulatory Visit: Payer: Self-pay | Admitting: Emergency Medicine

## 2013-04-15 ENCOUNTER — Ambulatory Visit: Payer: 59 | Admitting: Physician Assistant

## 2013-04-24 ENCOUNTER — Ambulatory Visit: Payer: Self-pay | Admitting: Emergency Medicine

## 2013-10-30 ENCOUNTER — Ambulatory Visit (INDEPENDENT_AMBULATORY_CARE_PROVIDER_SITE_OTHER): Payer: 59 | Admitting: Physician Assistant

## 2013-10-30 ENCOUNTER — Encounter: Payer: Self-pay | Admitting: Internal Medicine

## 2013-10-30 VITALS — BP 118/80 | HR 64 | Temp 98.2°F | Resp 16 | Ht 74.5 in | Wt 307.4 lb

## 2013-10-30 DIAGNOSIS — E559 Vitamin D deficiency, unspecified: Secondary | ICD-10-CM

## 2013-10-30 DIAGNOSIS — N529 Male erectile dysfunction, unspecified: Secondary | ICD-10-CM

## 2013-10-30 DIAGNOSIS — E291 Testicular hypofunction: Secondary | ICD-10-CM

## 2013-10-30 DIAGNOSIS — E538 Deficiency of other specified B group vitamins: Secondary | ICD-10-CM

## 2013-10-30 DIAGNOSIS — E782 Mixed hyperlipidemia: Secondary | ICD-10-CM | POA: Insufficient documentation

## 2013-10-30 DIAGNOSIS — E785 Hyperlipidemia, unspecified: Secondary | ICD-10-CM

## 2013-10-30 DIAGNOSIS — E669 Obesity, unspecified: Secondary | ICD-10-CM

## 2013-10-30 DIAGNOSIS — E119 Type 2 diabetes mellitus without complications: Secondary | ICD-10-CM

## 2013-10-30 DIAGNOSIS — Z79899 Other long term (current) drug therapy: Secondary | ICD-10-CM

## 2013-10-30 LAB — CBC WITH DIFFERENTIAL/PLATELET
Basophils Absolute: 0 10*3/uL (ref 0.0–0.1)
Basophils Relative: 0 % (ref 0–1)
EOS ABS: 0.2 10*3/uL (ref 0.0–0.7)
EOS PCT: 3 % (ref 0–5)
HEMATOCRIT: 40.2 % (ref 39.0–52.0)
HEMOGLOBIN: 13.9 g/dL (ref 13.0–17.0)
Lymphocytes Relative: 31 % (ref 12–46)
Lymphs Abs: 1.7 10*3/uL (ref 0.7–4.0)
MCH: 27.4 pg (ref 26.0–34.0)
MCHC: 34.6 g/dL (ref 30.0–36.0)
MCV: 79.3 fL (ref 78.0–100.0)
MONOS PCT: 8 % (ref 3–12)
Monocytes Absolute: 0.4 10*3/uL (ref 0.1–1.0)
Neutro Abs: 3.2 10*3/uL (ref 1.7–7.7)
Neutrophils Relative %: 58 % (ref 43–77)
Platelets: 255 10*3/uL (ref 150–400)
RBC: 5.07 MIL/uL (ref 4.22–5.81)
RDW: 15.5 % (ref 11.5–15.5)
WBC: 5.6 10*3/uL (ref 4.0–10.5)

## 2013-10-30 LAB — HEMOGLOBIN A1C
Hgb A1c MFr Bld: 7.1 % — ABNORMAL HIGH (ref <5.7)
Mean Plasma Glucose: 157 mg/dL — ABNORMAL HIGH (ref <117)

## 2013-10-30 MED ORDER — GLUCOSE BLOOD VI STRP: ORAL_STRIP | Status: AC

## 2013-10-30 NOTE — Progress Notes (Signed)
Assessment and Plan:  Hypertension: Continue medication, monitor blood pressure at home. Continue DASH diet. Cholesterol: Continue diet and exercise. Check cholesterol.  Diabetes-Continue diet and exercise. Check A1C. Discussed general issues about diabetes pathophysiology and management., Educational material distributed., Suggested low cholesterol diet., Encouraged aerobic exercise., Discussed foot care., Reminded to get yearly retinal exam. Vitamin D Def- check level and continue medications.  Fatigue/ED-? Low T versus sleepapnea versus DM- suggest sleep study, he will get at [facility] hospital, will chck testosterone.  Poor insight/noncompliance- very long conversation about diease process, and need to keep appointments, will need 3 month and explained likely with labs will need 1 month apointment  Continue diet and meds as discussed. Further disposition pending results of labs. Discussed med's effects and SE's.  OVER 40 minutes of exam, counseling, chart review, referral performed   HPI 48 y.o. male  presents for follow up after close to a year of not being seen, with hypertension, hyperlipidemia, diabetes and vitamin D. His blood pressure has been controlled at home, today their BP is BP: 118/80 mmHg He does workout, sporadicly and has not been to the gym in several week.  He denies chest pain, shortness of breath, dizziness.  He is not on cholesterol medication and denies myalgias. His cholesterol is not at goal. The cholesterol last visit was:   Lab Results  Component Value Date   CHOL 220* 03/18/2013   HDL 35* 03/18/2013   LDLCALC 152* 03/18/2013   TRIG 166* 03/18/2013   CHOLHDL 6.3 03/18/2013   He has not been working on diet and exercise for Diabetes, he is not on medications, and does not test his sugars, and denies paresthesia of the feet, polydipsia and polyuria. Last A1C in the office was:  Lab Results  Component Value Date   HGBA1C 6.2 03/18/2013   Patient is on Vitamin D  supplement. Lab Results  Component Value Date   VD25OH 49* 03/16/2012     He states that he has a history of low T and has been having fatigue, ED. And would like to get tested and treated.  Lab Results  Component Value Date   TESTOSTERONE 348.80 03/16/2012   Complains of fatigue, snoring, and frequent awakenings.  Very poor insight to disease and medical morbidities.   Current Medications:  Current Outpatient Prescriptions on File Prior to Visit  Medication Sig Dispense Refill  . glucose blood (FREESTYLE TEST STRIPS) test strip Use to test blood sugar once daily. Dx code: 250.00  100 each  3  . Lancets (FREESTYLE) lancets Use to test blood sugar once daily. Dx code: 250.00  100 each  3   No current facility-administered medications on file prior to visit.   Medical History:  Past Medical History  Diagnosis Date  . Depression   . OCD (obsessive compulsive disorder)   . Hypogonadism male   . Vitamin D deficiency   . Diabetes mellitus without complication    Allergies:  Allergies  Allergen Reactions  . Zoloft [Sertraline Hcl]     ED     Review of Systems: [X]  = complains of  [ ]  = denies  General: Fatigue Arly.Keller ] Fever [ ]  Chills [ ]  Weakness [ ]   Insomnia [ ]  Eyes: Redness [ ]  Blurred vision [ ]  Diplopia [ ]   ENT: Congestion [ ]  Sinus Pain [ ]  Post Nasal Drip [ ]  Sore Throat [ ]  Earache [ ]   Cardiac: Chest pain/pressure [ ]  SOB [ ]  Orthopnea [ ]   Palpitations [ ]   Paroxysmal nocturnal dyspnea[ ]  Claudication [ ]  Edema [ ]   Pulmonary: Cough [ ]  Wheezing[ ]   SOB [ ]   Snoring [ ]   GI: Nausea [ ]  Vomiting[ ]  Dysphagia[ ]  Heartburn[ ]  Abdominal pain [ ]  Constipation [ ] ; Diarrhea [ ] ; BRBPR [ ]  Melena[ ]  GU: Hematuria[ ]  Dysuria [ ]  Nocturia[ ]  Urgency [ ]   Hesitancy [ ]  Discharge [ ]  ED [ X ] Neuro: Headaches[ ]  Vertigo[ ]  Paresthesias[ ]  Spasm [ ]  Speech changes [ ]  Incoordination [ ]   Ortho: Arthritis [ ]  Joint pain [ ]  Muscle pain [ X] Joint swelling [ ]  Back Pain Arly.Keller[X  ] Skin:  Rash [ ]   Pruritis [ ]  Change in skin lesion [ ]   Psych: Depression[ ]  Anxiety[ ]  Confusion [ ]  Memory loss [ ]   Heme/Lypmh: Bleeding [ ]  Bruising [ ]  Enlarged lymph nodes [ ]   Endocrine: Visual blurring [ ]  Paresthesia [ ]  Polyuria [ ]  Polydypsea [ ]    Heat/cold intolerance [ ]  Hypoglycemia [ ]   Family history- Review and unchanged Social history- Review and unchanged Physical Exam: BP 118/80  Pulse 64  Temp(Src) 98.2 F (36.8 C) (Temporal)  Resp 16  Ht 6' 2.5" (1.892 m)  Wt 307 lb 6.4 oz (139.436 kg)  BMI 38.95 kg/m2 Wt Readings from Last 3 Encounters:  10/30/13 307 lb 6.4 oz (139.436 kg)  03/18/13 293 lb (132.904 kg)  04/12/12 288 lb 3.2 oz (130.727 kg)   General Appearance: Well nourished, obese, in no apparent distress. Eyes: PERRLA, EOMs, conjunctiva no swelling or erythema Sinuses: No Frontal/maxillary tenderness ENT/Mouth: Ext aud canals clear, TMs without erythema, bulging. No erythema, swelling, or exudate on post pharynx, crowded mouth.  Tonsils not swollen or erythematous. Hearing normal.  Neck: Supple, thyroid normal.  Respiratory: Respiratory effort normal, BS equal bilaterally without rales, rhonchi, wheezing or stridor.  Cardio: RRR with no MRGs. Brisk peripheral pulses without edema.  Abdomen: Soft, + BS.  Non tender, no guarding, rebound, hernias, masses. Lymphatics: Non tender without lymphadenopathy.  Musculoskeletal: Full ROM, 5/5 strength, normal gait.  Skin: Warm, dry without rashes, lesions, ecchymosis.  Neuro: Cranial nerves intact. No cerebellar symptoms. Sensation intact.  Psych: Awake and oriented X 3, normal affect, Insight and Judgment appropriate.    Quentin Mullingollier, Steffany Schoenfelder 10:44 AM

## 2013-10-30 NOTE — Patient Instructions (Signed)
If your morning sugar is always below 100 then the issue is with your sugar spiking after meals. Try to take your blood sugar approximately 2 hours after eating, this number should be less than 200. If it is not, think about the foods that you ate and better choices you can make.     Bad carbs also include fruit juice, alcohol, and sweet tea. These are empty calories that do not signal to your brain that you are full.   Please remember the good carbs are still carbs which convert into sugar. So please measure them out no more than 1/2-1 cup of rice, oatmeal, pasta, and beans.  Veggies are however free foods! Pile them on.   I like lean protein at every meal such as chicken, Malaw[place]urkey, pork chops, cottage cheese, etc. Just do not fry these meats and please center your meal around vegetable, the meats should be a side dish.   No all fruit is created equal. Please see the list below, the fruit at the bottom is higher in sugars than the fruit at the top    Recommendations For Diabetic Patients:   -  Take medications as prescribed  -  Recommend Dr Francis DowseJoel Fuhrman's book "The End of Diabetes " - Can get at  www.Amazon.com and encourage also get the Audio CD book  - AVOID Animal products, ie. Meat - red/white, Poultry and Dairy/especially cheese - Exercise at least 5 times a week for 30 minutes or preferably daily.  - No Smoking - Drink less than 2 drinks a day.  - Monitor your feet for sores - Have yearly Eye Exams - Recommend annual Flu vaccine  - Recommend Pneumovax and Prevnar vaccines - Shingles Vaccine (Zostavax) if over 49 y.o.  Goals:   - BMI less than 24 - Fasting sugar less than 130 or less than 150 if tapering medicines to lose weight  - Systolic BP less than 130  - Diastolic BP less than 80 - Bad LDL Cholesterol less than 70 - Triglycerides less than 150  [facility] WE NEED YOU TO TRY TO KEEP YOUR APPOINTMENTS SO WE CAN TRY TO GET YOUR MEDICATIONS  DONE.

## 2013-10-31 ENCOUNTER — Other Ambulatory Visit: Payer: Self-pay | Admitting: *Deleted

## 2013-10-31 LAB — BASIC METABOLIC PANEL WITH GFR
BUN: 10 mg/dL (ref 6–23)
CHLORIDE: 104 meq/L (ref 96–112)
CO2: 27 mEq/L (ref 19–32)
Calcium: 9.7 mg/dL (ref 8.4–10.5)
Creat: 0.95 mg/dL (ref 0.50–1.35)
Glucose, Bld: 139 mg/dL — ABNORMAL HIGH (ref 70–99)
POTASSIUM: 4.7 meq/L (ref 3.5–5.3)
SODIUM: 138 meq/L (ref 135–145)

## 2013-10-31 LAB — LIPID PANEL
CHOL/HDL RATIO: 5.4 ratio
Cholesterol: 190 mg/dL (ref 0–200)
HDL: 35 mg/dL — AB (ref >39)
LDL CALC: 134 mg/dL — AB (ref 0–99)
Triglycerides: 105 mg/dL (ref <150)
VLDL: 21 mg/dL (ref 0–40)

## 2013-10-31 LAB — HEPATIC FUNCTION PANEL
ALT: 33 U/L (ref 0–53)
AST: 20 U/L (ref 0–37)
Albumin: 4.3 g/dL (ref 3.5–5.2)
Alkaline Phosphatase: 55 U/L (ref 39–117)
BILIRUBIN INDIRECT: 0.3 mg/dL (ref 0.2–1.2)
BILIRUBIN TOTAL: 0.4 mg/dL (ref 0.2–1.2)
Bilirubin, Direct: 0.1 mg/dL (ref 0.0–0.3)
Total Protein: 6.8 g/dL (ref 6.0–8.3)

## 2013-10-31 LAB — VITAMIN B12: Vitamin B-12: 414 pg/mL (ref 211–911)

## 2013-10-31 LAB — TESTOSTERONE: TESTOSTERONE: 231 ng/dL — AB (ref 300–890)

## 2013-10-31 LAB — INSULIN, FASTING: INSULIN FASTING, SERUM: 41 u[IU]/mL — AB (ref 3–28)

## 2013-10-31 LAB — TSH: TSH: 2.172 u[IU]/mL (ref 0.350–4.500)

## 2013-10-31 LAB — MAGNESIUM: Magnesium: 1.9 mg/dL (ref 1.5–2.5)

## 2013-10-31 LAB — VITAMIN D 25 HYDROXY (VIT D DEFICIENCY, FRACTURES): Vit D, 25-Hydroxy: 52 ng/mL (ref 30–89)

## 2013-10-31 MED ORDER — METFORMIN HCL ER 500 MG PO TB24: 1000.0000 mg | ORAL_TABLET | Freq: Two times a day (BID) | ORAL | Status: DC

## 2013-10-31 MED ORDER — TESTOSTERONE CYPIONATE 200 MG/ML IM SOLN: 400.0000 mg | INTRAMUSCULAR | Status: DC

## 2013-10-31 NOTE — Addendum Note (Signed)
Addended by: Quentin MullingOLLIER, Natalie Leclaire R on: 10/31/2013 08:24 AM   Modules accepted: Orders

## 2013-11-06 ENCOUNTER — Ambulatory Visit (INDEPENDENT_AMBULATORY_CARE_PROVIDER_SITE_OTHER): Payer: 59

## 2013-11-06 DIAGNOSIS — E291 Testicular hypofunction: Secondary | ICD-10-CM

## 2013-11-06 MED ORDER — TESTOSTERONE CYPIONATE 200 MG/ML IM SOLN
400.0000 mg | Freq: Once | INTRAMUSCULAR | Status: AC
  Administered 2013-11-06: 400 mg via INTRAMUSCULAR

## 2013-11-06 NOTE — Progress Notes (Signed)
Patient ID: Tyrone Holloway, male   DOB: [birth year], 48 y.o.   MRN: [id number] Patient here today for first testosterone injection. Patient received 0.2 ml IM Right glut and tolerated well. He advises that he will bring his girlfriend for next injection so that she can learn to give him injection. Per Quentin MullingAmanda Collier, PA advised patient okay to schedule next injection for 3 weeks.

## 2013-11-27 ENCOUNTER — Ambulatory Visit: Payer: Self-pay

## 2013-12-03 ENCOUNTER — Ambulatory Visit (INDEPENDENT_AMBULATORY_CARE_PROVIDER_SITE_OTHER): Payer: 59 | Admitting: Family Medicine

## 2013-12-03 VITALS — BP 142/80 | HR 75 | Temp 98.2°F | Resp 16 | Ht 74.0 in | Wt 304.4 lb

## 2013-12-03 DIAGNOSIS — M545 Low back pain, unspecified: Secondary | ICD-10-CM

## 2013-12-03 MED ORDER — CYCLOBENZAPRINE HCL 10 MG PO TABS: 10.0000 mg | ORAL_TABLET | Freq: Every day | ORAL | Status: DC

## 2013-12-03 MED ORDER — PREDNISONE 20 MG PO TABS: 40.0000 mg | ORAL_TABLET | Freq: Every day | ORAL | Status: DC

## 2013-12-03 NOTE — Patient Instructions (Signed)
Back Pain, Adult Low back pain is very common. About 1 in 5 people have back pain.The cause of low back pain is rarely dangerous. The pain often gets better over time.About half of people with a sudden onset of back pain feel better in just 2 weeks. About 8 in 10 people feel better by 6 weeks.  CAUSES Some common causes of back pain include:  Strain of the muscles or ligaments supporting the spine.  Wear and tear (degeneration) of the spinal discs.  Arthritis.  Direct injury to the back. DIAGNOSIS Most of the time, the direct cause of low back pain is not known.However, back pain can be treated effectively even when the exact cause of the pain is unknown.Answering your caregiver's questions about your overall health and symptoms is one of the most accurate ways to make sure the cause of your pain is not dangerous. If your caregiver needs more information, he or she may order lab work or imaging tests (X-rays or MRIs).However, even if imaging tests show changes in your back, this usually does not require surgery. HOME CARE INSTRUCTIONS For many people, back pain returns.Since low back pain is rarely dangerous, it is often a condition that people can learn to manageon their own.   Remain active. It is stressful on the back to sit or stand in one place. Do not sit, drive, or stand in one place for more than 30 minutes at a time. Take short walks on level surfaces as soon as pain allows.Try to increase the length of time you walk each day.  Do not stay in bed.Resting more than 1 or 2 days can delay your recovery.  Do not avoid exercise or work.Your body is made to move.It is not dangerous to be active, even though your back may hurt.Your back will likely heal faster if you return to being active before your pain is gone.  Pay attention to your body when you bend and lift. Many people have less discomfortwhen lifting if they bend their knees, keep the load close to their bodies,and  avoid twisting. Often, the most comfortable positions are those that put less stress on your recovering back.  Find a comfortable position to sleep. Use a firm mattress and lie on your side with your knees slightly bent. If you lie on your back, put a pillow under your knees.  Only take over-the-counter or prescription medicines as directed by your caregiver. Over-the-counter medicines to reduce pain and inflammation are often the most helpful.Your caregiver may prescribe muscle relaxant drugs.These medicines help dull your pain so you can more quickly return to your normal activities and healthy exercise.  Put ice on the injured area.  Put ice in a plastic bag.  Place a towel between your skin and the bag.  Leave the ice on for 15-20 minutes, 03-04 times a day for the first 2 to 3 days. After that, ice and heat may be alternated to reduce pain and spasms.  Ask your caregiver about trying back exercises and gentle massage. This may be of some benefit.  Avoid feeling anxious or stressed.Stress increases muscle tension and can worsen back pain.It is important to recognize when you are anxious or stressed and learn ways to manage it.Exercise is a great option. SEEK MEDICAL CARE IF:  You have pain that is not relieved with rest or medicine.  You have pain that does not improve in 1 week.  You have new symptoms.  You are generally not feeling well. SEEK   IMMEDIATE MEDICAL CARE IF:   You have pain that radiates from your back into your legs.  You develop new bowel or bladder control problems.  You have unusual weakness or numbness in your arms or legs.  You develop nausea or vomiting.  You develop abdominal pain.  You feel faint. Document Released: 03/07/2005 Document Revised: 09/06/2011 Document Reviewed: 07/09/2013 Exit[employer] Patient Information 2015 [employer]. This information is not intended to replace advice given to you by your health care provider. Make sure you  discuss any questions you have with your health care provider.  

## 2013-12-03 NOTE — Progress Notes (Signed)
This 48 year old man who works in the [employer]. He felt pain at work about one week ago in the right lower back, similar to what he had 8 years ago. This time the pain has not gotten better and if anything has gotten worse. He is very stiff this morning but eventually his back seemed to warm up and he was able to move a little easier. Aref he's been taking Advil with some relief.  Is able to sleep without difficulty, has no fever, has no weakness in the flexor numbness.  Objective: Obese middle-aged man in no acute distress moving slowly and carefully. Patient has pain when he rotates to the right Patient has pain with straight leg raising on the right when seated Patient has good strength in both legs with no muscle wasting Patient has symmetrical reflexes in the knees and ankles. There is no rash in his back.  Assessment: Patient has low back pain with some sciatica.  Plan:Right-sided low back pain without sciatica - Plan: predniSONE (DELTASONE) 20 MG tablet, cyclobenzaprine (FLEXERIL) 10 MG tablet  Signed, Elvina Sidle, MD

## 2013-12-11 ENCOUNTER — Ambulatory Visit (INDEPENDENT_AMBULATORY_CARE_PROVIDER_SITE_OTHER): Payer: 59 | Admitting: Physician Assistant

## 2013-12-11 VITALS — BP 132/70 | HR 80 | Temp 98.1°F | Resp 16 | Ht 74.5 in | Wt 306.0 lb

## 2013-12-11 DIAGNOSIS — E669 Obesity, unspecified: Secondary | ICD-10-CM

## 2013-12-11 DIAGNOSIS — E119 Type 2 diabetes mellitus without complications: Secondary | ICD-10-CM

## 2013-12-11 DIAGNOSIS — E559 Vitamin D deficiency, unspecified: Secondary | ICD-10-CM

## 2013-12-11 DIAGNOSIS — E291 Testicular hypofunction: Secondary | ICD-10-CM

## 2013-12-11 DIAGNOSIS — E785 Hyperlipidemia, unspecified: Secondary | ICD-10-CM

## 2013-12-11 MED ORDER — TESTOSTERONE CYPIONATE 200 MG/ML IM SOLN
400.0000 mg | Freq: Once | INTRAMUSCULAR | Status: AC
  Administered 2013-12-11: 400 mg via INTRAMUSCULAR

## 2013-12-11 NOTE — Patient Instructions (Addendum)
Pharmacy [facility] [place] [phone]  Recommendations For Diabetic Patients:   -  Take medications as prescribed  -  Recommend Dr Andris Baumann book "The End of Diabetes " - Can get at  www.Amazon.com and encourage also get the Audio CD book  - AVOID Animal products, ie. Meat - red/white, Poultry and Dairy/especially cheese - Exercise at least 5 times a week for 30 minutes or preferably daily.  - No Smoking - Drink less than 2 drinks a day.  - Monitor your feet for sores - Have yearly Eye Exams - Recommend annual Flu vaccine  - Recommend Pneumovax and Prevnar vaccines - Shingles Vaccine (Zostavax) if over 57 y.o.  Goals:   - BMI less than 24 - Fasting sugar less than 130 or less than 150 if tapering medicines to lose weight  - Systolic BP less than 130  - Diastolic BP less than 80 - Bad LDL Cholesterol less than 70 - yours was 130 - Triglycerides less than 150   Your A1C is a measure of your sugar over the past 3 months and is not affected by what you have eaten over the past few days. Diabetes increases your chances of stroke and heart attack over 300 % and is the leading cause of blindness and kidney failure in the [place]. Please make sure you decrease bad carbs like white bread, white rice, potatoes, corn, soft drinks, pasta, cereals, refined sugars, sweet tea, dried fruits, and fruit juice. Good carbs are okay to eat in moderation like sweet potatoes, brown rice, whole grain pasta/bread, most fruit (except dried fruit) and you can eat as many veggies as you want.   Greater than 6.5 is considered diabetic. Between 6.4 and 5.7 is prediabetic If your A1C is less than 5.7 you are NOT diabetic.    Bad carbs also include fruit juice, alcohol, and sweet tea. These are empty calories that do not signal to your brain that you are full.   Please remember the good carbs are still carbs which convert into sugar. So please measure them out no more than 1/2-1 cup of rice, oatmeal, pasta,  and beans.  Veggies are however free foods! Pile them on.   I like lean protein at every meal such as chicken, [place], pork chops, cottage cheese, etc. Just do not fry these meats and please center your meal around vegetable, the meats should be a side dish.   No all fruit is created equal. Please see the list below, the fruit at the bottom is higher in sugars than the fruit at the top    Your LDL is not in range. Your LDL is the bad cholesterol that can lead to heart attack and stroke. To lower your number you can decrease your fatty foods, red meat, cheese, milk and increase fiber like whole grains and veggies. You can also add a fiber supplement like Metamucil or Benefiber.    We are starting you on Metformin to prevent or treat diabetes. Metformin does not cause low blood sugars. In order to create energy your cells need insulin and sugar but sometime your cells do not accept the insulin and this can cause increased sugars and decreased energy. The Metformin helps your cells accept insulin and the sugar to give you more energy.   The two most common side effects are nausea and diarrhea, follow these rules to avoid it! You can take imodium per box instructions when starting metformin if needed.   Rules of metformin: 1) start  out slow with only one pill daily. Our goal for you is 4 pills a day or  total.  2) take with your largest meal. 3) Take with least amount of carbs.   Call if you have any problems.

## 2013-12-11 NOTE — Progress Notes (Signed)
HPI 48 y.o.male presents for follow up. He was started on testosterone last visit, states he does not see much benefit, got infection today. Will get testosterone level next visit.  He has started on 2 pills daily and denies any diarrhea, he has not been checking his sugars. He is not on a low dose Asprin. LDL not at goal.  Lab Results  Component Value Date   HGBA1C 7.1* 10/30/2013   Lab Results  Component Value Date   CHOL 190 10/30/2013   HDL 35* 10/30/2013   LDLCALC 134* 10/30/2013   TRIG 105 10/30/2013   CHOLHDL 5.4 10/30/2013     Past Medical History  Diagnosis Date  . Depression   . OCD (obsessive compulsive disorder)   . Hypogonadism male   . Vitamin D deficiency   . Diabetes mellitus without complication      Allergies  Allergen Reactions  . Zoloft [Sertraline Hcl]     ED     Current Outpatient Prescriptions on File Prior to Visit  Medication Sig Dispense Refill  . cyclobenzaprine (FLEXERIL) 10 MG tablet Take 1 tablet (10 mg total) by mouth at bedtime.  10 tablet  0  . glucose blood (FREESTYLE TEST STRIPS) test strip Use to test blood sugar once daily. Dx code: 250.00  100 each  3  . Lancets (FREESTYLE) lancets Use to test blood sugar once daily. Dx code: 250.00  100 each  3  . metFORMIN (GLUCOPHAGE XR) 500 MG 24 hr tablet Take 2 tablets (1,000 mg total) by mouth 2 (two) times daily with a meal.  120 tablet  0  . testosterone cypionate (DEPOTESTOTERONE CYPIONATE) 200 MG/ML injection Inject 2 mLs (400 mg total) into the muscle every 14 (fourteen) days.  10 mL  0   No current facility-administered medications on file prior to visit.    ROS: all negative expect above.   Physical: BP 132/70  Pulse 80  Temp(Src) 98.1 F (36.7 C)  Resp 16  Ht 6' 2.5" (1.892 m)  Wt 306 lb (138.801 kg)  BMI 38.77 kg/m2 Wt Readings from Last 3 Encounters:  12/11/13 306 lb (138.801 kg)  12/03/13 304 lb 6.4 oz (138.075 kg)  10/30/13 307 lb 6.4 oz (139.436 kg)   General Appearance:  Well nourished, in no apparent distress. Eyes: PERRLA, EOMs. Sinuses: No Frontal/maxillary tenderness ENT/Mouth: Ext aud canals clear, normal light reflex with TMs without erythema, bulging. Post pharynx without erythema, swelling, exudate.  Respiratory: CTAB Cardio: RRR, no murmurs, rubs or gallops. Peripheral pulses brisk and equal bilaterally, without edema. No aortic or femoral bruits. Abdomen: Soft, with bowl sounds. Nontender, no guarding, rebound. Lymphatics: Non tender without lymphadenopathy.  Musculoskeletal: Full ROM all peripheral extremities, 5/5 strength, and normal gait. Skin: Warm, dry without rashes, lesions, ecchymosis.  Neuro: Cranial nerves intact, reflexes equal bilaterally. Normal muscle tone, no cerebellar symptoms. Sensation intact.  Pysch: Awake and oriented X 3, normal affect, Insight and Judgment appropriate.   Assessment and Plan: HTN- continue medications, DASH diet, exercise and monitor at home. Call if greater than 130/80.  DMII- Discussed general issues about diabetes pathophysiology and management., Educational material distributed., Suggested low cholesterol diet., Encouraged aerobic exercise., Discussed foot care., Reminded to get yearly retinal exam. He would like to continue the Metformin for now, he will increase to 4 pills a day, add bASA, and recheck in 2 months.  Hypogonadism- continue replacement therapy, check testosterone levels as needed.  Hyperlipidemia- if at next visit his chol is still elevated will  add statin

## 2013-12-25 ENCOUNTER — Ambulatory Visit: Payer: Self-pay

## 2014-02-14 DIAGNOSIS — R030 Elevated blood-pressure reading, without diagnosis of hypertension: Secondary | ICD-10-CM

## 2014-02-14 DIAGNOSIS — IMO0001 Reserved for inherently not codable concepts without codable children: Secondary | ICD-10-CM | POA: Insufficient documentation

## 2014-02-14 DIAGNOSIS — E349 Endocrine disorder, unspecified: Secondary | ICD-10-CM | POA: Insufficient documentation

## 2014-02-14 NOTE — Patient Instructions (Signed)

## 2014-02-14 NOTE — Progress Notes (Signed)
Patient ID: Tyrone Holloway, male   DOB: 02-12-1966, 48 y.o.   MRN: [id number]  Annual Screening Comprehensive Examination  This very nice 48 y.o.DBM presents for complete physical.  Patient has been followed for HTN, Morbid Obesity, T2_NIDDM, Hyperlipidemia, and Vitamin D Deficiency.   Labile HTN has been monitored expectantly for several years. Patient's BP has been controlled at home.Today's BP was 162/94 mmHg. Patient denies any cardiac symptoms as chest pain, palpitations, shortness of breath, dizziness or ankle swelling.   Patient's hyperlipidemia is controlled with diet and medications. Patient denies myalgias or other medication SE's. Last lipids were not at goal -  Total Chol 190; HDL 35; LDL 134; Trig 105 on  10/30/2013   Patient has Morbid Obesity (BMI 38.83) and consequent  T2_NIDDM prediabetes since 2013 and patient denies reactive hypoglycemic symptoms, visual blurring, diabetic polys or paresthesias. He admits not consistently taking his Metformin and not monitoring his CBG's. Last A1c was 7.1% on  10/30/2013.   Finally, patient has history of Vitamin D Deficiency of 36 in 2011and last vitamin D was 52 on 10/30/2013.  Medication Sig  . cyclobenzaprine (FLEXERIL) 10 MG tablet Take 1 tablet (10 mg total) by mouth at bedtime.  Marland Kitchen. glucose blood (FREESTYLE TEST STRIPS) test strip Use to test blood sugar once daily. Dx code: 250.00  . Lancets (FREESTYLE) lancets Use to test blood sugar once daily. Dx code: 250.00  . Metformin 500 mg XR  Take 2 tablets 2 times daily with a meal.  .  DEPOTESTOTERONE  200 MG/ML  Inject 2 mL IM every 14  Days. (off x 1 mo +)     Allergies  Allergen Reactions  . Zoloft [Sertraline Hcl]     ED   Past Medical History  Diagnosis Date  . Depression   . OCD (obsessive compulsive disorder)   . Hypogonadism male   . Vitamin D deficiency   . Diabetes mellitus without complication    Health Maintenance  Topic Date Due  . PNEUMOCOCCAL POLYSACCHARIDE  VACCINE (1) 09/12/1966  . FOOT EXAM  09/12/1974  . OPHTHALMOLOGY EXAM  09/12/1974  . URINE MICROALBUMIN  09/12/1974  . TETANUS/TDAP  09/12/1983  . INFLUENZA VACCINE  10/19/2013  . HEMOGLOBIN A1C  05/02/2014   Immunization History  Administered Date(s) Administered  . Influenza-Unspecified 01/27/2011   Family History  Problem Relation Age of Onset  . Hypertension Mother   . Hyperlipidemia Mother   . Hypertension Father   . Diabetes Father   . Hypertension Brother   . Diabetes Brother   . Cancer Maternal Grandfather   . Cancer of Colon Maternal Uncle    History   Social History  . Marital Status: Divorced x 2    Spouse Name: N/A    Number of Children: dau 49 yo and 3 sons 424, [id number] & 49 yo.   . Years of Education: N/A   Occupational History  . 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 Smoking status: Never Smoker   . Smokeless tobacco: Not on file  . Alcohol Use: No  . Drug Use: No  . Sexual Activity: No    ROS Constitutional: Denies fever, chills, weight loss/gain, headaches, insomnia, fatigue, night sweats or change in appetite. Eyes: Denies redness, blurred vision, diplopia, discharge, itchy or watery eyes.  ENT: Denies discharge, congestion, post nasal drip, epistaxis, sore throat, earache, hearing loss,  dental pain, Tinnitus, Vertigo, Sinus pain or snoring.  Cardio: Denies chest pain, palpitations, irregular heartbeat, syncope, dyspnea, diaphoresis, orthopnea, PND, claudication or edema Respiratory: denies cough, dyspnea, DOE, pleurisy, hoarseness, laryngitis or wheezing.  Gastrointestinal: Denies dysphagia, heartburn, reflux, water brash, pain, cramps, nausea, vomiting, bloating, diarrhea, constipation, hematemesis, melena, hematochezia, jaundice or hemorrhoids Genitourinary: Denies dysuria, frequency, urgency, nocturia, hesitancy, discharge, hematuria or flank  pain Musculoskeletal: Denies arthralgia, myalgia, stiffness, Jt. Swelling, pain, limp or strain/sprain. Denies Falls. Skin: Denies puritis, rash, hives, warts, acne, eczema or change in skin lesion Neuro: No weakness, tremor, incoordination, spasms, paresthesia or pain Psychiatric: Denies confusion, memory loss or sensory loss. Denies Depression. Endocrine: Denies change in weight, skin, hair change, nocturia, and paresthesia, diabetic polys, visual blurring or hyper / hypo glycemic episodes.  Heme/Lymph: No excessive bleeding, bruising or enlarged lymph nodes.  Physical Exam  BP 162/94 and rechecked @ 145/90   Pulse 60  Temp 98.2 F   Resp 18  Ht 6' 2.5"   Wt 306 lb 6.4 oz   BMI 38.83   General Appearance: Well nourished, in no apparent distress. Eyes: PERRLA, EOMs, conjunctiva no swelling or erythema, normal fundi and vessels. Sinuses: No frontal/maxillary tenderness ENT/Mouth: EACs patent / TMs  nl. Nares clear without erythema, swelling, mucoid exudates. Oral hygiene is good. No erythema, swelling, or exudate. Tongue normal, non-obstructing. Tonsils not swollen or erythematous. Hearing normal.  Neck: Supple, thyroid normal. No bruits, nodes or JVD. Respiratory: Respiratory effort normal.  BS equal and clear bilateral without rales, rhonci, wheezing or stridor. Cardio: Heart sounds are normal with regular rate and rhythm and no murmurs, rubs or gallops. Peripheral pulses are normal and equal bilaterally without edema. No aortic or femoral bruits. Chest: symmetric with normal excursions and percussion.  Abdomen: Flat, soft, with bowl sounds. Nontender, no guarding, rebound, hernias, masses, or organomegaly.  Lymphatics: Non tender without lymphadenopathy.  Genitourinary: No hernias.Testes nl. DRE - prostate nl for age - smooth & firm w/o nodules. Musculoskeletal: Full ROM all peripheral extremities, joint stability, 5/5 strength, and normal gait. Skin: Warm and dry without rashes,  lesions, cyanosis, clubbing or  ecchymosis.  Neuro: Cranial nerves intact, reflexes equal bilaterally. Normal muscle tone, no cerebellar symptoms. Sensation intact.  Pysch: Awake and oriented X 3with normal affect, insight and judgment appropriate.   Assessment and Plan  1. Annual Screening Examination 2. Labile HTN 3. Hyperlipidemia 4. Morbid Obesity (BMI 38.83) 5. T2_NIDDM 6. Vitamin D Deficiency   Continue prudent diet as discussed, weight control, BP monitoring, regular exercise, and medications as discussed.  Discussed med effects and SE's. Routine screening labs and tests as requested with regular follow-up as recommended.

## 2014-02-17 ENCOUNTER — Encounter: Payer: Self-pay | Admitting: Internal Medicine

## 2014-02-17 ENCOUNTER — Ambulatory Visit (INDEPENDENT_AMBULATORY_CARE_PROVIDER_SITE_OTHER): Payer: 59 | Admitting: Internal Medicine

## 2014-02-17 VITALS — BP 162/94 | HR 60 | Temp 98.2°F | Resp 18 | Ht 74.5 in | Wt 306.4 lb

## 2014-02-17 DIAGNOSIS — R7989 Other specified abnormal findings of blood chemistry: Secondary | ICD-10-CM

## 2014-02-17 DIAGNOSIS — E559 Vitamin D deficiency, unspecified: Secondary | ICD-10-CM

## 2014-02-17 DIAGNOSIS — IMO0001 Reserved for inherently not codable concepts without codable children: Secondary | ICD-10-CM

## 2014-02-17 DIAGNOSIS — R030 Elevated blood-pressure reading, without diagnosis of hypertension: Secondary | ICD-10-CM

## 2014-02-17 DIAGNOSIS — Z113 Encounter for screening for infections with a predominantly sexual mode of transmission: Secondary | ICD-10-CM

## 2014-02-17 DIAGNOSIS — Z0001 Encounter for general adult medical examination with abnormal findings: Secondary | ICD-10-CM

## 2014-02-17 DIAGNOSIS — E782 Mixed hyperlipidemia: Secondary | ICD-10-CM

## 2014-02-17 DIAGNOSIS — Z1212 Encounter for screening for malignant neoplasm of rectum: Secondary | ICD-10-CM

## 2014-02-17 DIAGNOSIS — Z125 Encounter for screening for malignant neoplasm of prostate: Secondary | ICD-10-CM

## 2014-02-17 DIAGNOSIS — E119 Type 2 diabetes mellitus without complications: Secondary | ICD-10-CM

## 2014-02-17 DIAGNOSIS — R6889 Other general symptoms and signs: Secondary | ICD-10-CM

## 2014-02-17 DIAGNOSIS — R945 Abnormal results of liver function studies: Secondary | ICD-10-CM

## 2014-02-17 DIAGNOSIS — E349 Endocrine disorder, unspecified: Secondary | ICD-10-CM

## 2014-02-18 LAB — BASIC METABOLIC PANEL WITH GFR
BUN: 13 mg/dL (ref 6–23)
CALCIUM: 9.5 mg/dL (ref 8.4–10.5)
CO2: 26 mEq/L (ref 19–32)
Chloride: 103 mEq/L (ref 96–112)
Creat: 0.98 mg/dL (ref 0.50–1.35)
GFR, Est Non African American: >89 mL/min
GLUCOSE: 106 mg/dL — AB (ref 70–99)
Potassium: 4.4 mEq/L (ref 3.5–5.3)
SODIUM: 138 meq/L (ref 135–145)

## 2014-02-18 LAB — URINALYSIS, MICROSCOPIC ONLY
BACTERIA UA: NONE SEEN
CASTS: NONE SEEN
CRYSTALS: NONE SEEN
Squamous Epithelial / LPF: NONE SEEN

## 2014-02-18 LAB — HEPATITIS A ANTIBODY, TOTAL: Hep A Total Ab: NONREACTIVE

## 2014-02-18 LAB — LIPID PANEL
Cholesterol: 201 mg/dL — ABNORMAL HIGH (ref 0–200)
HDL: 35 mg/dL — ABNORMAL LOW (ref >39)
LDL Cholesterol: 132 mg/dL — ABNORMAL HIGH (ref 0–99)
Total CHOL/HDL Ratio: 5.7 Ratio
Triglycerides: 170 mg/dL — ABNORMAL HIGH (ref <150)
VLDL: 34 mg/dL (ref 0–40)

## 2014-02-18 LAB — HEPATITIS C ANTIBODY: HCV Ab: NEGATIVE

## 2014-02-18 LAB — MICROALBUMIN / CREATININE URINE RATIO
Creatinine, Urine: 69.9 mg/dL
Microalb Creat Ratio: 4.3 mg/g (ref 0.0–30.0)
Microalb, Ur: 0.3 mg/dL (ref <2.0)

## 2014-02-18 LAB — HEPATIC FUNCTION PANEL
ALT: 27 U/L (ref 0–53)
AST: 16 U/L (ref 0–37)
Albumin: 4.3 g/dL (ref 3.5–5.2)
Alkaline Phosphatase: 49 U/L (ref 39–117)
BILIRUBIN DIRECT: 0.1 mg/dL (ref 0.0–0.3)
Indirect Bilirubin: 0.2 mg/dL (ref 0.2–1.2)
TOTAL PROTEIN: 7.1 g/dL (ref 6.0–8.3)
Total Bilirubin: 0.3 mg/dL (ref 0.2–1.2)

## 2014-02-18 LAB — MAGNESIUM: MAGNESIUM: 1.9 mg/dL (ref 1.5–2.5)

## 2014-02-18 LAB — HEPATITIS B SURFACE ANTIBODY,QUALITATIVE: HEP B S AB: NEGATIVE

## 2014-02-18 LAB — HEMOGLOBIN A1C
HEMOGLOBIN A1C: 7.4 % — AB (ref <5.7)
MEAN PLASMA GLUCOSE: 166 mg/dL — AB (ref <117)

## 2014-02-18 LAB — HEPATITIS B CORE ANTIBODY, TOTAL: Hep B Core Total Ab: NONREACTIVE

## 2014-02-18 LAB — VITAMIN B12: Vitamin B-12: 480 pg/mL (ref 211–911)

## 2014-02-18 LAB — VITAMIN D 25 HYDROXY (VIT D DEFICIENCY, FRACTURES): Vit D, 25-Hydroxy: 32 ng/mL (ref 30–100)

## 2014-02-18 LAB — TESTOSTERONE: Testosterone: 244 ng/dL — ABNORMAL LOW (ref 300–890)

## 2014-02-18 LAB — TSH: TSH: 2.407 u[IU]/mL (ref 0.350–4.500)

## 2014-02-19 LAB — PSA: PSA: 0.76 ng/mL (ref <=4.00)

## 2014-02-19 LAB — INSULIN, FASTING: Insulin fasting, serum: 25.6 u[IU]/mL — ABNORMAL HIGH (ref 2.0–19.6)

## 2014-02-20 LAB — HEPATITIS B E ANTIBODY: HEPATITIS BE ANTIBODY: NONREACTIVE

## 2014-02-26 ENCOUNTER — Telehealth: Payer: Self-pay

## 2014-02-26 NOTE — Telephone Encounter (Signed)
Left message for patient to return call for lab results. 

## 2014-02-26 NOTE — Telephone Encounter (Signed)
-----   Message from Lucky CowboyWilliam McKeown, MD sent at 02/23/2014 11:34 PM EST ----- - Vit B12 - WNL - PSA - Nl/OK - Testosterone low - need to continue shots  - Hepatitis A B & C - all Neg & OK - HIV/AIDS tests -  neg & OK - A1c 7.4% - keeps getting worse & worse - need stricter diet and weight loss  and can cure at this point if lose weight - please get Dr Andris BaumannJoel Fuhrman's books as discussed - Vit D 32 - Very Very Extremely low- currently on no Vit D - Recc take 5,000 u x 2 caps = 10,000 units EVERY day

## 2014-03-19 ENCOUNTER — Other Ambulatory Visit: Payer: Self-pay | Admitting: Physician Assistant

## 2014-03-19 ENCOUNTER — Other Ambulatory Visit: Payer: Self-pay

## 2014-03-19 MED ORDER — METFORMIN HCL ER 500 MG PO TB24: ORAL_TABLET | ORAL | Status: DC

## 2014-05-22 ENCOUNTER — Ambulatory Visit: Payer: Self-pay | Admitting: Physician Assistant

## 2014-05-22 ENCOUNTER — Ambulatory Visit (INDEPENDENT_AMBULATORY_CARE_PROVIDER_SITE_OTHER): Payer: 59 | Admitting: Physician Assistant

## 2014-05-22 ENCOUNTER — Encounter: Payer: Self-pay | Admitting: Physician Assistant

## 2014-05-22 VITALS — BP 126/62 | HR 68 | Temp 98.2°F | Resp 18 | Ht 74.5 in | Wt 314.0 lb

## 2014-05-22 DIAGNOSIS — E119 Type 2 diabetes mellitus without complications: Secondary | ICD-10-CM

## 2014-05-22 DIAGNOSIS — R030 Elevated blood-pressure reading, without diagnosis of hypertension: Secondary | ICD-10-CM

## 2014-05-22 DIAGNOSIS — IMO0001 Reserved for inherently not codable concepts without codable children: Secondary | ICD-10-CM

## 2014-05-22 DIAGNOSIS — E349 Endocrine disorder, unspecified: Secondary | ICD-10-CM

## 2014-05-22 DIAGNOSIS — E782 Mixed hyperlipidemia: Secondary | ICD-10-CM

## 2014-05-22 DIAGNOSIS — E559 Vitamin D deficiency, unspecified: Secondary | ICD-10-CM

## 2014-05-22 DIAGNOSIS — Z79899 Other long term (current) drug therapy: Secondary | ICD-10-CM

## 2014-05-22 DIAGNOSIS — E291 Testicular hypofunction: Secondary | ICD-10-CM

## 2014-05-22 MED ORDER — METFORMIN HCL ER 500 MG PO TB24: ORAL_TABLET | ORAL | Status: DC

## 2014-05-22 NOTE — Patient Instructions (Addendum)
Add ENTERIC COATED low dose 81 mg Aspirin daily OR can do every other day if you have easy bruising to protect your heart and head.   Diabetes is a very complicated disease...lets simplify it.  An easy way to look at it to understand the complications is if you think of the extra sugar floating in your blood stream as glass shards floating through your blood stream.    Diabetes affects your small vessels first: 1) The glass shards (sugar) scraps down the tiny blood vessels in your eyes and lead to diabetic retinopathy, the leading cause of blindness in the [place]. Diabetes is the leading cause of newly diagnosed adult (84 to 50 years of age) blindness in the [place].  2) The glass shards scratches down the tiny vessels of your legs leading to nerve damage called neuropathy and can lead to amputations of your feet. More than 60% of all non-traumatic amputations of lower limbs occur in people with diabetes.  3) Over time the small vessels in your brain are shredded and closed off, individually this does not cause any problems but over a long period of time many of the small vessels being blocked can lead to Vascular Dementia.   4) Your kidney's are a filter system and have a "net" that keeps certain things in the body and lets bad things out. Sugar shreds this net and leads to kidney damage and eventually failure. Decreasing the sugar that is destroying the net and certain blood pressure medications can help stop or decrease progression of kidney disease. Diabetes was the primary cause of kidney failure in 44 percent of all new cases in 2011.  5) Diabetes also destroys the small vessels in your penis that lead to erectile dysfunction. Eventually the vessels are so damaged that you may not be responsive to cialis or viagra.   Diabetes and your large vessels: Your larger vessels consist of your coronary arteries in your heart and the carotid vessels to your brain. Diabetes or even increased sugars  put you at 300% increased risk of heart attack and stroke and this is why.. The sugar scrapes down your large blood vessels and your body sees this as an internal injury and tries to repair itself. Just like you get a scab on your skin, your platelets will stick to the blood vessel wall trying to heal it. This is why we have diabetics on low dose aspirin daily, this prevents the platelets from sticking and can prevent plaque formation. In addition, your body takes cholesterol and tries to shove it into the open wound. This is why we want your LDL, or bad cholesterol, below 70.   The combination of platelets and cholesterol over 5-10 years forms plaque that can break off and cause a heart attack or stroke.   PLEASE REMEMBER:  Diabetes is preventable! Up to 85 percent of complications and morbidities among individuals with type 2 diabetes can be prevented, delayed, or effectively treated and minimized with regular visits to a health professional, appropriate monitoring and medication, and a healthy diet and lifestyle.  .   Bad carbs also include fruit juice, alcohol, and sweet tea. These are empty calories that do not signal to your brain that you are full.   Please remember the good carbs are still carbs which convert into sugar. So please measure them out no more than 1/2-1 cup of rice, oatmeal, pasta, and beans  Veggies are however free foods! Pile them on.   Not all fruit  is created equal. Please see the list below, the fruit at the bottom is higher in sugars than the fruit at the top. Please avoid all dried fruits.    Your A1C is a measure of your sugar over the past 3 months and is not affected by what you have eaten over the past few days. Diabetes increases your chances of stroke and heart attack over 300 % and is the leading cause of blindness and kidney failure in the [place]. Please make sure you decrease bad carbs like white bread, white rice, potatoes, corn, soft drinks, pasta,  cereals, refined sugars, sweet tea, dried fruits, and fruit juice. Good carbs are okay to eat in moderation like sweet potatoes, brown rice, whole grain pasta/bread, most fruit (except dried fruit) and you can eat as many veggies as you want.   Greater than 6.5 is considered diabetic. Between 6.4 and 5.7 is prediabetic If your A1C is less than 5.7 you are NOT diabetic.  Targets for Glucose Readings: Time of Check Target for patients WITHOUT Diabetes Target for DIABETICS  Before Meals Less than 100  less than 150  Two hours after meals Less than 200  Less than 250     9 Ways to Naturally Increase Testosterone Levels  1.   Lose Weight If you're overweight, shedding the excess pounds may increase your testosterone levels, according to research presented at the Endocrine Society's 2012 meeting. Overweight men are more likely to have low testosterone levels to begin with, so this is an important trick to increase your body's testosterone production when you need it most.  2.   High-Intensity Exercise like Peak Fitness  Short intense exercise has a proven positive effect on increasing testosterone levels and preventing its decline. That's unlike aerobics or prolonged moderate exercise, which have shown to have negative or no effect on testosterone levels. Having a whey protein meal after exercise can further enhance the satiety/testosterone-boosting impact (hunger hormones cause the opposite effect on your testosterone and libido). Here's a summary of what a typical high-intensity Peak Fitness routine might look like: " Warm up for three minutes  " Exercise as hard and fast as you can for 30 seconds. You should feel like you couldn't possibly go on another few seconds  " Recover at a slow to moderate pace for 90 seconds  " Repeat the high intensity exercise and recovery 7 more times .  3.   Consume Plenty of Zinc The mineral zinc is important for testosterone production, and supplementing your  diet for as little as six weeks has been shown to cause a marked improvement in testosterone among men with low levels.1 Likewise, research has shown that restricting dietary sources of zinc leads to a significant decrease in testosterone, while zinc supplementation increases it2 -- and even protects men from exercised-induced reductions in testosterone levels.3 It's estimated that up to 45 percent of adults over the age of 60 may have lower than recommended zinc intakes; even when dietary supplements were added in, an estimated 20-25 percent of older adults still had inadequate zinc intakes, according to a [employer] and Nutrition Examination Survey.4 Your diet is the best source of zinc; along with protein-rich foods like meats and fish, other good dietary sources of zinc include raw milk, raw cheese, beans, and yogurt or kefir made from raw milk. It can be difficult to obtain enough dietary zinc if you're a vegetarian, and also for meat-eaters as well, largely because of conventional farming methods that  rely heavily on chemical fertilizers and pesticides. These chemicals deplete the soil of nutrients ... nutrients like zinc that must be absorbed by plants in order to be passed on to you. In many cases, you may further deplete the nutrients in your food by the way you prepare it. For most food, cooking it will drastically reduce its levels of nutrients like zinc . particularly over-cooking, which many people do. If you decide to use a zinc supplement, stick to a dosage of less than 40 mg a day, as this is the recommended adult upper limit. Taking too much zinc can interfere with your body's ability to absorb other minerals, especially copper, and may cause nausea as a side effect.  4.   Strength Training In addition to Peak Fitness, strength training is also known to boost testosterone levels, provided you are doing so intensely enough. When strength training to boost testosterone, you'll want to  increase the weight and lower your number of reps, and then focus on exercises that work a large number of muscles, such as dead lifts or squats.  You can "turbo-charge" your weight training by going slower. By slowing down your movement, you're actually turning it into a high-intensity exercise. Super Slow movement allows your muscle, at the microscopic level, to access the maximum number of cross-bridges between the protein filaments that produce movement in the muscle.   5.   Optimize Your Vitamin D Levels Vitamin D, a steroid hormone, is essential for the healthy development of the nucleus of the sperm cell, and helps maintain semen quality and sperm count. Vitamin D also increases levels of testosterone, which may boost libido. In one study, overweight men who were given vitamin D supplements had a significant increase in testosterone levels after one year.5   6.   Reduce Stress When you're under a lot of stress, your body releases high levels of the stress hormone cortisol. This hormone actually blocks the effects of testosterone,6 presumably because, from a biological standpoint, testosterone-associated behaviors (mating, competing, aggression) may have lowered your chances of survival in an emergency (hence, the "fight or flight" response is dominant, courtesy of cortisol).  7.   Limit or Eliminate Sugar from Your Diet Testosterone levels decrease after you eat sugar, which is likely because the sugar leads to a high insulin level, another factor leading to low testosterone.7 Based on USDA estimates, the average American consumes 12 teaspoons of sugar a day, which equates to about TWO TONS of sugar during a lifetime.  8.   Eat Healthy Fats By healthy, this means not only mon- and polyunsaturated fats, like that found in avocadoes and nuts, but also saturated, as these are essential for building testosterone. Research shows that a diet with less than 40 percent of energy as fat (and that mainly  from animal sources, i.e. saturated) lead to a decrease in testosterone levels.8 My personal diet is about 60-70 percent healthy fat, and other experts agree that the ideal diet includes somewhere between 50-70 percent fat.  It's important to understand that your body requires saturated fats from animal and vegetable sources (such as meat, dairy, certain oils, and tropical plants like coconut) for optimal functioning, and if you neglect this important food group in favor of sugar, grains and other starchy carbs, your health and weight are almost guaranteed to suffer. Examples of healthy fats you can eat more of to give your testosterone levels a boost include: Olives and Olive oil  Coconuts and coconut oil Butter made  from raw grass-fed organic milk Raw nuts, such as, almonds or pecans Organic pastured egg yolks Avocados Grass-fed meats Palm oil Unheated organic nut oils   9.   Boost Your Intake of Branch Chain Amino Acids (BCAA) from Foods Like Whey Protein Research suggests that BCAAs result in higher testosterone levels, particularly when taken along with resistance training.9 While BCAAs are available in supplement form, you'll find the highest concentrations of BCAAs like leucine in dairy products - especially quality cheeses and whey protein. Even when getting leucine from your natural food supply, it's often wasted or used as a building block instead of an anabolic agent. So to create the correct anabolic environment, you need to boost leucine consumption way beyond mere maintenance levels. That said, keep in mind that using leucine as a free form amino acid can be highly counterproductive as when free form amino acids are artificially administrated, they rapidly enter your circulation while disrupting insulin function, and impairing your body's glycemic control. Food-based leucine is really the ideal form that can benefit your muscles without side effects.  Can call Dr. Toni Arthurs to get evaluated for  dental sleep appliance. # [phone]  OR you can try Dr. Reatha Armour in [facility] # [phone]  We will send notes. Call and get price quote on both.   Magnesium low add 250 mg with food to prevent diarrhea. Magnesium may help with muscle cramps, constipation, vitamin D and potassium absorption.

## 2014-05-22 NOTE — Progress Notes (Signed)
Assessment and Plan:  Hypertension: Continue medication, monitor blood pressure at home. Continue DASH diet.  Reminder to go to the ER if any CP, SOB, nausea, dizziness, severe HA, changes vision/speech, left arm numbness and tingling and jaw pain. Cholesterol: Continue diet and exercise. Check cholesterol.  Diabetes without complications-Continue diet and exercise. Check A1C Vitamin D Def- check level and continue medications.  Obesity with co morbidities- long discussion about weight loss, diet, and exercise Hypogonadism- wants to try natural treatments.    Continue diet and meds as discussed. Further disposition pending results of labs. Discussed med's effects and SE's.    HPI 49 y.o. male  presents for 3 month follow up with hypertension, hyperlipidemia, diabetes and vitamin D.  His blood pressure has been controlled at home, today their BP is BP: 126/62 mmHg.  He does not workout. He denies chest pain, shortness of breath, dizziness.  He is not on cholesterol medication and denies myalgias. His cholesterol is not at goal. The cholesterol was:  02/17/2014: Cholesterol, Total 201*; HDL-C 35*; LDL (calc) 132*; Triglycerides 170*  He has not been working on diet and exercise for diabetes without complications, he is not on bASA, he is not on ACE/ARB, he is on metformin and denies  paresthesia of the feet, polydipsia, polyuria and visual disturbances. Last A1C was: 02/17/2014: Hemoglobin-A1c 7.4*  Patient is on Vitamin D supplement. 02/17/2014: VITD 32  He has a history of testosterone deficiency and is on testosterone replacement, last one 2 weeks ago. He states that the testosterone helps with his energy, libido, muscle mass. Lab Results  Component Value Date   TESTOSTERONE 244* 02/17/2014  BMI is Body mass index is 39.79 kg/(m^2)., he is working on diet and exercise, he has OSA and is not on sleep apnea regularly. Wt Readings from Last 3 Encounters:  05/22/14 314 lb (142.429 kg)  02/17/14  306 lb 6.4 oz (138.982 kg)  12/11/13 306 lb (138.801 kg)   Current Medications:  Current Outpatient Prescriptions on File Prior to Visit  Medication Sig Dispense Refill  . glucose blood (FREESTYLE TEST STRIPS) test strip Use to test blood sugar once daily. Dx code: 250.00 100 each 3  . Lancets (FREESTYLE) lancets Use to test blood sugar once daily. Dx code: 250.00 100 each 3  . testosterone cypionate (DEPOTESTOTERONE CYPIONATE) 200 MG/ML injection Inject 2 mLs (400 mg total) into the muscle every 14 (fourteen) days. 10 mL 0   No current facility-administered medications on file prior to visit.   Medical History:  Past Medical History  Diagnosis Date  . Depression   . OCD (obsessive compulsive disorder)   . Hypogonadism male   . Vitamin D deficiency   . Diabetes mellitus without complication    Allergies:  Allergies  Allergen Reactions  . Zoloft [Sertraline Hcl]     ED     Review of Systems:  Review of Systems  Constitutional: Positive for malaise/fatigue. Negative for fever, chills, weight loss and diaphoresis.  HENT: Negative.   Eyes: Negative.   Respiratory: Negative.   Cardiovascular: Negative.   Gastrointestinal: Negative.   Genitourinary: Negative.   Musculoskeletal: Positive for myalgias (leg pain at night). Negative for back pain, joint pain, falls and neck pain.  Skin: Negative.   Neurological: Negative.  Negative for weakness.  Endo/Heme/Allergies: Negative.   Psychiatric/Behavioral: Negative.     Family history- Review and unchanged Social history- Review and unchanged Physical Exam: BP 126/62 mmHg  Pulse 68  Temp(Src) 98.2 F (36.8 C) (Temporal)  Resp 18  Ht 6' 2.5" (1.892 m)  Wt 314 lb (142.429 kg)  BMI 39.79 kg/m2 Wt Readings from Last 3 Encounters:  05/22/14 314 lb (142.429 kg)  02/17/14 306 lb 6.4 oz (138.982 kg)  12/11/13 306 lb (138.801 kg)   General Appearance: Well nourished, in no apparent distress. Eyes: PERRLA, EOMs, conjunctiva no  swelling or erythema Sinuses: No Frontal/maxillary tenderness ENT/Mouth: Ext aud canals clear, TMs without erythema, bulging. No erythema, swelling, or exudate on post pharynx.  Tonsils not swollen or erythematous. Hearing normal.  Neck: Supple, thyroid normal.  Respiratory: Respiratory effort normal, obese BS equal bilaterally without rales, rhonchi, wheezing or stridor.  Cardio: RRR with no MRGs. Brisk peripheral pulses without edema.  Abdomen: Soft, + BS.  Non tender, no guarding, rebound, hernias, masses. Lymphatics: Non tender without lymphadenopathy.  Musculoskeletal: Full ROM, 5/5 strength, Normal gait Skin: Warm, dry without rashes, lesions, ecchymosis.  Neuro: Cranial nerves intact. No cerebellar symptoms.  Psych: Awake and oriented X 3, normal affect, Insight and Judgment appropriate.    Quentin Mullingollier, Isaul Landi, PA-C 4:06 PM [facility]

## 2014-05-23 LAB — CBC WITH DIFFERENTIAL/PLATELET
Basophils Absolute: 0 10*3/uL (ref 0.0–0.1)
Basophils Relative: 0 % (ref 0–1)
Eosinophils Absolute: 0.3 10*3/uL (ref 0.0–0.7)
Eosinophils Relative: 5 % (ref 0–5)
HCT: 41.2 % (ref 39.0–52.0)
Hemoglobin: 13.8 g/dL (ref 13.0–17.0)
LYMPHS ABS: 2 10*3/uL (ref 0.7–4.0)
Lymphocytes Relative: 35 % (ref 12–46)
MCH: 27.3 pg (ref 26.0–34.0)
MCHC: 33.5 g/dL (ref 30.0–36.0)
MCV: 81.6 fL (ref 78.0–100.0)
MONO ABS: 0.4 10*3/uL (ref 0.1–1.0)
MPV: 9.7 fL (ref 8.6–12.4)
Monocytes Relative: 7 % (ref 3–12)
NEUTROS PCT: 53 % (ref 43–77)
Neutro Abs: 3.1 10*3/uL (ref 1.7–7.7)
PLATELETS: 340 10*3/uL (ref 150–400)
RBC: 5.05 MIL/uL (ref 4.22–5.81)
RDW: 16 % — ABNORMAL HIGH (ref 11.5–15.5)
WBC: 5.8 10*3/uL (ref 4.0–10.5)

## 2014-05-23 LAB — MAGNESIUM: Magnesium: 1.9 mg/dL (ref 1.5–2.5)

## 2014-05-23 LAB — LIPID PANEL
CHOLESTEROL: 164 mg/dL (ref 0–200)
HDL: 26 mg/dL — ABNORMAL LOW (ref >=40)
LDL Cholesterol: 117 mg/dL — ABNORMAL HIGH (ref 0–99)
Total CHOL/HDL Ratio: 6.3 Ratio
Triglycerides: 107 mg/dL (ref <150)
VLDL: 21 mg/dL (ref 0–40)

## 2014-05-23 LAB — BASIC METABOLIC PANEL WITH GFR
BUN: 12 mg/dL (ref 6–23)
CALCIUM: 9.6 mg/dL (ref 8.4–10.5)
CO2: 26 mEq/L (ref 19–32)
Chloride: 106 mEq/L (ref 96–112)
Creat: 0.98 mg/dL (ref 0.50–1.35)
GFR, Est Non African American: >89 mL/min
GLUCOSE: 126 mg/dL — AB (ref 70–99)
Potassium: 4.3 mEq/L (ref 3.5–5.3)
Sodium: 141 mEq/L (ref 135–145)

## 2014-05-23 LAB — HEPATIC FUNCTION PANEL
ALBUMIN: 4.1 g/dL (ref 3.5–5.2)
ALK PHOS: 42 U/L (ref 39–117)
ALT: 32 U/L (ref 0–53)
AST: 27 U/L (ref 0–37)
BILIRUBIN INDIRECT: 0.4 mg/dL (ref 0.2–1.2)
Bilirubin, Direct: 0.1 mg/dL (ref 0.0–0.3)
Total Bilirubin: 0.5 mg/dL (ref 0.2–1.2)
Total Protein: 6.6 g/dL (ref 6.0–8.3)

## 2014-05-23 LAB — HEMOGLOBIN A1C
Hgb A1c MFr Bld: 7.1 % — ABNORMAL HIGH (ref <5.7)
Mean Plasma Glucose: 157 mg/dL — ABNORMAL HIGH (ref <117)

## 2014-05-23 LAB — TSH: TSH: 1.73 u[IU]/mL (ref 0.350–4.500)

## 2014-05-23 LAB — VITAMIN D 25 HYDROXY (VIT D DEFICIENCY, FRACTURES): VIT D 25 HYDROXY: 37 ng/mL (ref 30–100)

## 2014-08-26 ENCOUNTER — Encounter: Payer: Self-pay | Admitting: Internal Medicine

## 2014-08-26 ENCOUNTER — Ambulatory Visit: Payer: Self-pay | Admitting: Internal Medicine

## 2014-08-26 DIAGNOSIS — Z79899 Other long term (current) drug therapy: Secondary | ICD-10-CM | POA: Insufficient documentation

## 2014-08-26 NOTE — Progress Notes (Signed)
Patient ID: Tyrone Giovanniwayne Guisinger Sr., male   DOB: Feb 18, 1966, 49 y.o.   MRN: [id number]

## 2014-09-23 ENCOUNTER — Ambulatory Visit (INDEPENDENT_AMBULATORY_CARE_PROVIDER_SITE_OTHER): Payer: 59 | Admitting: Internal Medicine

## 2014-09-23 ENCOUNTER — Encounter: Payer: Self-pay | Admitting: Internal Medicine

## 2014-09-23 VITALS — BP 140/68 | HR 70 | Temp 98.2°F | Resp 18 | Ht 74.5 in | Wt 286.0 lb

## 2014-09-23 DIAGNOSIS — R030 Elevated blood-pressure reading, without diagnosis of hypertension: Secondary | ICD-10-CM

## 2014-09-23 DIAGNOSIS — E782 Mixed hyperlipidemia: Secondary | ICD-10-CM

## 2014-09-23 DIAGNOSIS — M545 Low back pain, unspecified: Secondary | ICD-10-CM

## 2014-09-23 DIAGNOSIS — E669 Obesity, unspecified: Secondary | ICD-10-CM

## 2014-09-23 DIAGNOSIS — Z79899 Other long term (current) drug therapy: Secondary | ICD-10-CM

## 2014-09-23 DIAGNOSIS — E119 Type 2 diabetes mellitus without complications: Secondary | ICD-10-CM

## 2014-09-23 DIAGNOSIS — E559 Vitamin D deficiency, unspecified: Secondary | ICD-10-CM

## 2014-09-23 DIAGNOSIS — IMO0001 Reserved for inherently not codable concepts without codable children: Secondary | ICD-10-CM

## 2014-09-23 MED ORDER — CYCLOBENZAPRINE HCL 10 MG PO TABS: 10.0000 mg | ORAL_TABLET | Freq: Every day | ORAL | Status: DC

## 2014-09-23 MED ORDER — METFORMIN HCL ER 500 MG PO TB24: ORAL_TABLET | ORAL | Status: DC

## 2014-09-23 MED ORDER — MELOXICAM 15 MG PO TABS
15.0000 mg | ORAL_TABLET | Freq: Every day | ORAL | Status: DC
Start: 2014-09-23 — End: 2015-03-04

## 2014-09-23 NOTE — Patient Instructions (Signed)
Low Back Sprain with Rehab  A sprain is an injury in which a ligament is torn. The ligaments of the lower back are vulnerable to sprains. However, they are strong and require great force to be injured. These ligaments are important for stabilizing the spinal column. Sprains are classified into three categories. Grade 1 sprains cause pain, but the tendon is not lengthened. Grade 2 sprains include a lengthened ligament, due to the ligament being stretched or partially ruptured. With grade 2 sprains there is still function, although the function may be decreased. Grade 3 sprains involve a complete tear of the tendon or muscle, and function is usually impaired. SYMPTOMS   Severe pain in the lower back.  Sometimes, a feeling of a "pop," "snap," or tear, at the time of injury.  Tenderness and sometimes swelling at the injury site.  Uncommonly, bruising (contusion) within 48 hours of injury.  Muscle spasms in the back. CAUSES  Low back sprains occur when a force is placed on the ligaments that is greater than they can handle. Common causes of injury include:  Performing a stressful act while off-balance.  Repetitive stressful activities that involve movement of the lower back.  Direct hit (trauma) to the lower back. RISK INCREASES WITH:  Contact sports (football, wrestling).  Collisions (major skiing accidents).  Sports that require throwing or lifting (baseball, weightlifting).  Sports involving twisting of the spine (gymnastics, diving, tennis, golf).  Poor strength and flexibility.  Inadequate protection.  Previous back injury or surgery (especially fusion). PREVENTION  Wear properly fitted and padded protective equipment.  Warm up and stretch properly before activity.  Allow for adequate recovery between workouts.  Maintain physical fitness:  Strength, flexibility, and endurance.  Cardiovascular fitness.  Maintain a healthy body weight. PROGNOSIS  If treated  properly, low back sprains usually heal with non-surgical treatment. The length of time for healing depends on the severity of the injury.  RELATED COMPLICATIONS   Recurring symptoms, resulting in a chronic problem.  Chronic inflammation and pain in the low back.  Delayed healing or resolution of symptoms, especially if activity is resumed too soon.  Prolonged impairment.  Unstable or arthritic joints of the low back. TREATMENT  Treatment first involves the use of ice and medicine, to reduce pain and inflammation. The use of strengthening and stretching exercises may help reduce pain with activity. These exercises may be performed at home or with a therapist. Severe injuries may require referral to a therapist for further evaluation and treatment, such as ultrasound. Your caregiver may advise that you wear a back brace or corset, to help reduce pain and discomfort. Often, prolonged bed rest results in greater harm then benefit. Corticosteroid injections may be recommended. However, these should be reserved for the most serious cases. It is important to avoid using your back when lifting objects. At night, sleep on your back on a firm mattress, with a pillow placed under your knees. If non-surgical treatment is unsuccessful, surgery may be needed.  MEDICATION   If pain medicine is needed, nonsteroidal anti-inflammatory medicines (aspirin and ibuprofen), or other minor pain relievers (acetaminophen), are often advised.  Do not take pain medicine for 7 days before surgery.  Prescription pain relievers may be given, if your caregiver thinks they are needed. Use only as directed and only as much as you need.  Ointments applied to the skin may be helpful.  Corticosteroid injections may be given by your caregiver. These injections should be reserved for the most serious cases,   because they may only be given a certain number of times. HEAT AND COLD  Cold treatment (icing) should be applied for 10  to 15 minutes every 2 to 3 hours for inflammation and pain, and immediately after activity that aggravates your symptoms. Use ice packs or an ice massage.  Heat treatment may be used before performing stretching and strengthening activities prescribed by your caregiver, physical therapist, or athletic trainer. Use a heat pack or a warm water soak. SEEK MEDICAL CARE IF:   Symptoms get worse or do not improve in 2 to 4 weeks, despite treatment.  You develop numbness or weakness in either leg.  You lose bowel or bladder function.  Any of the following occur after surgery: fever, increased pain, swelling, redness, drainage of fluids, or bleeding in the affected area.  New, unexplained symptoms develop. (Drugs used in treatment may produce side effects.) EXERCISES  RANGE OF MOTION (ROM) AND STRETCHING EXERCISES - Low Back Sprain Most people with lower back pain will find that their symptoms get worse with excessive bending forward (flexion) or arching at the lower back (extension). The exercises that will help resolve your symptoms will focus on the opposite motion.  Your physician, physical therapist or athletic trainer will help you determine which exercises will be most helpful to resolve your lower back pain. Do not complete any exercises without first consulting with your caregiver. Discontinue any exercises which make your symptoms worse, until you speak to your caregiver. If you have pain, numbness or tingling which travels down into your buttocks, leg or foot, the goal of the therapy is for these symptoms to move closer to your back and eventually resolve. Sometimes, these leg symptoms will get better, but your lower back pain may worsen. This is often an indication of progress in your rehabilitation. Be very alert to any changes in your symptoms and the activities in which you participated in the 24 hours prior to the change. Sharing this information with your caregiver will allow him or her to  most efficiently treat your condition. These exercises may help you when beginning to rehabilitate your injury. Your symptoms may resolve with or without further involvement from your physician, physical therapist or athletic trainer. While completing these exercises, remember:   Restoring tissue flexibility helps normal motion to return to the joints. This allows healthier, less painful movement and activity.  An effective stretch should be held for at least 30 seconds.  A stretch should never be painful. You should only feel a gentle lengthening or release in the stretched tissue. FLEXION RANGE OF MOTION AND STRETCHING EXERCISES: STRETCH - Flexion, Single Knee to Chest   Lie on a firm bed or floor with both legs extended in front of you.  Keeping one leg in contact with the floor, bring your opposite knee to your chest. Hold your leg in place by either grabbing behind your thigh or at your knee.  Pull until you feel a gentle stretch in your low back. Hold __________ seconds.  Slowly release your grasp and repeat the exercise with the opposite side. Repeat __________ times. Complete this exercise __________ times per day.  STRETCH - Flexion, Double Knee to Chest  Lie on a firm bed or floor with both legs extended in front of you.  Keeping one leg in contact with the floor, bring your opposite knee to your chest.  Tense your stomach muscles to support your back and then lift your other knee to your chest. Hold your legs   in place by either grabbing behind your thighs or at your knees.  Pull both knees toward your chest until you feel a gentle stretch in your low back. Hold __________ seconds.  Tense your stomach muscles and slowly return one leg at a time to the floor. Repeat __________ times. Complete this exercise __________ times per day.  STRETCH - Low Trunk Rotation  Lie on a firm bed or floor. Keeping your legs in front of you, bend your knees so they are both pointed toward the  ceiling and your feet are flat on the floor.  Extend your arms out to the side. This will stabilize your upper body by keeping your shoulders in contact with the floor.  Gently and slowly drop both knees together to one side until you feel a gentle stretch in your low back. Hold for __________ seconds.  Tense your stomach muscles to support your lower back as you bring your knees back to the starting position. Repeat the exercise to the other side. Repeat __________ times. Complete this exercise __________ times per day  EXTENSION RANGE OF MOTION AND FLEXIBILITY EXERCISES: STRETCH - Extension, Prone on Elbows   Lie on your stomach on the floor, a bed will be too soft. Place your palms about shoulder width apart and at the height of your head.  Place your elbows under your shoulders. If this is too painful, stack pillows under your chest.  Allow your body to relax so that your hips drop lower and make contact more completely with the floor.  Hold this position for __________ seconds.  Slowly return to lying flat on the floor. Repeat __________ times. Complete this exercise __________ times per day.  RANGE OF MOTION - Extension, Prone Press Ups  Lie on your stomach on the floor, a bed will be too soft. Place your palms about shoulder width apart and at the height of your head.  Keeping your back as relaxed as possible, slowly straighten your elbows while keeping your hips on the floor. You may adjust the placement of your hands to maximize your comfort. As you gain motion, your hands will come more underneath your shoulders.  Hold this position __________ seconds.  Slowly return to lying flat on the floor. Repeat __________ times. Complete this exercise __________ times per day.  RANGE OF MOTION- Quadruped, Neutral Spine   Assume a hands and knees position on a firm surface. Keep your hands under your shoulders and your knees under your hips. You may place padding under your knees for  comfort.  Drop your head and point your tailbone toward the ground below you. This will round out your lower back like an angry cat. Hold this position for __________ seconds.  Slowly lift your head and release your tail bone so that your back sags into a large arch, like an old horse.  Hold this position for __________ seconds.  Repeat this until you feel limber in your low back.  Now, find your "sweet spot." This will be the most comfortable position somewhere between the two previous positions. This is your neutral spine. Once you have found this position, tense your stomach muscles to support your low back.  Hold this position for __________ seconds. Repeat __________ times. Complete this exercise __________ times per day.  STRENGTHENING EXERCISES - Low Back Sprain These exercises may help you when beginning to rehabilitate your injury. These exercises should be done near your "sweet spot." This is the neutral, low-back arch, somewhere between fully rounded   and fully arched, that is your least painful position. When performed in this safe range of motion, these exercises can be used for people who have either a flexion or extension based injury. These exercises may resolve your symptoms with or without further involvement from your physician, physical therapist or athletic trainer. While completing these exercises, remember:   Muscles can gain both the endurance and the strength needed for everyday activities through controlled exercises.  Complete these exercises as instructed by your physician, physical therapist or athletic trainer. Increase the resistance and repetitions only as guided.  You may experience muscle soreness or fatigue, but the pain or discomfort you are trying to eliminate should never worsen during these exercises. If this pain does worsen, stop and make certain you are following the directions exactly. If the pain is still present after adjustments, discontinue the  exercise until you can discuss the trouble with your caregiver. STRENGTHENING - Deep Abdominals, Pelvic Tilt   Lie on a firm bed or floor. Keeping your legs in front of you, bend your knees so they are both pointed toward the ceiling and your feet are flat on the floor.  Tense your lower abdominal muscles to press your low back into the floor. This motion will rotate your pelvis so that your tail bone is scooping upwards rather than pointing at your feet or into the floor. With a gentle tension and even breathing, hold this position for __________ seconds. Repeat __________ times. Complete this exercise __________ times per day.  STRENGTHENING - Abdominals, Crunches   Lie on a firm bed or floor. Keeping your legs in front of you, bend your knees so they are both pointed toward the ceiling and your feet are flat on the floor. Cross your arms over your chest.  Slightly tip your chin down without bending your neck.  Tense your abdominals and slowly lift your trunk high enough to just clear your shoulder blades. Lifting higher can put excessive stress on the lower back and does not further strengthen your abdominal muscles.  Control your return to the starting position. Repeat __________ times. Complete this exercise __________ times per day.  STRENGTHENING - Quadruped, Opposite UE/LE Lift   Assume a hands and knees position on a firm surface. Keep your hands under your shoulders and your knees under your hips. You may place padding under your knees for comfort.  Find your neutral spine and gently tense your abdominal muscles so that you can maintain this position. Your shoulders and hips should form a rectangle that is parallel with the floor and is not twisted.  Keeping your trunk steady, lift your right hand no higher than your shoulder and then your left leg no higher than your hip. Make sure you are not holding your breath. Hold this position for __________ seconds.  Continuing to keep  your abdominal muscles tense and your back steady, slowly return to your starting position. Repeat with the opposite arm and leg. Repeat __________ times. Complete this exercise __________ times per day.  STRENGTHENING - Abdominals and Quadriceps, Straight Leg Raise   Lie on a firm bed or floor with both legs extended in front of you.  Keeping one leg in contact with the floor, bend the other knee so that your foot can rest flat on the floor.  Find your neutral spine, and tense your abdominal muscles to maintain your spinal position throughout the exercise.  Slowly lift your straight leg off the floor about 6 inches for a count   of 15, making sure to not hold your breath.  Still keeping your neutral spine, slowly lower your leg all the way to the floor. Repeat this exercise with each leg __________ times. Complete this exercise __________ times per day. POSTURE AND BODY MECHANICS CONSIDERATIONS - Low Back Sprain Keeping correct posture when sitting, standing or completing your activities will reduce the stress put on different body tissues, allowing injured tissues a chance to heal and limiting painful experiences. The following are general guidelines for improved posture. Your physician or physical therapist will provide you with any instructions specific to your needs. While reading these guidelines, remember:  The exercises prescribed by your provider will help you have the flexibility and strength to maintain correct postures.  The correct posture provides the best environment for your joints to work. All of your joints have less wear and tear when properly supported by a spine with good posture. This means you will experience a healthier, less painful body.  Correct posture must be practiced with all of your activities, especially prolonged sitting and standing. Correct posture is as important when doing repetitive low-stress activities (typing) as it is when doing a single heavy-load  activity (lifting). RESTING POSITIONS Consider which positions are most painful for you when choosing a resting position. If you have pain with flexion-based activities (sitting, bending, stooping, squatting), choose a position that allows you to rest in a less flexed posture. You would want to avoid curling into a fetal position on your side. If your pain worsens with extension-based activities (prolonged standing, working overhead), avoid resting in an extended position such as sleeping on your stomach. Most people will find more comfort when they rest with their spine in a more neutral position, neither too rounded nor too arched. Lying on a non-sagging bed on your side with a pillow between your knees, or on your back with a pillow under your knees will often provide some relief. Keep in mind, being in any one position for a prolonged period of time, no matter how correct your posture, can still lead to stiffness. PROPER SITTING POSTURE In order to minimize stress and discomfort on your spine, you must sit with correct posture. Sitting with good posture should be effortless for a healthy body. Returning to good posture is a gradual process. Many people can work toward this most comfortably by using various supports until they have the flexibility and strength to maintain this posture on their own. When sitting with proper posture, your ears will fall over your shoulders and your shoulders will fall over your hips. You should use the back of the chair to support your upper back. Your lower back will be in a neutral position, just slightly arched. You may place a small pillow or folded towel at the base of your lower back for  support.  When working at a desk, create an environment that supports good, upright posture. Without extra support, muscles tire, which leads to excessive strain on joints and other tissues. Keep these recommendations in mind: CHAIR:  A chair should be able to slide under your desk  when your back makes contact with the back of the chair. This allows you to work closely.  The chair's height should allow your eyes to be level with the upper part of your monitor and your hands to be slightly lower than your elbows. BODY POSITION  Your feet should make contact with the floor. If this is not possible, use a foot rest.  Keep your   ears over your shoulders. This will reduce stress on your neck and low back. INCORRECT SITTING POSTURES  If you are feeling tired and unable to assume a healthy sitting posture, do not slouch or slump. This puts excessive strain on your back tissues, causing more damage and pain. Healthier options include:  Using more support, like a lumbar pillow.  Switching tasks to something that requires you to be upright or walking.  Talking a brief walk.  Lying down to rest in a neutral-spine position. PROLONGED STANDING WHILE SLIGHTLY LEANING FORWARD  When completing a task that requires you to lean forward while standing in one place for a long time, place either foot up on a stationary 2-4 inch high object to help maintain the best posture. When both feet are on the ground, the lower back tends to lose its slight inward curve. If this curve flattens (or becomes too large), then the back and your other joints will experience too much stress, tire more quickly, and can cause pain. CORRECT STANDING POSTURES Proper standing posture should be assumed with all daily activities, even if they only take a few moments, like when brushing your teeth. As in sitting, your ears should fall over your shoulders and your shoulders should fall over your hips. You should keep a slight tension in your abdominal muscles to brace your spine. Your tailbone should point down to the ground, not behind your body, resulting in an over-extended swayback posture.  INCORRECT STANDING POSTURES  Common incorrect standing postures include a forward head, locked knees and/or an excessive  swayback. WALKING Walk with an upright posture. Your ears, shoulders and hips should all line-up. PROLONGED ACTIVITY IN A FLEXED POSITION When completing a task that requires you to bend forward at your waist or lean over a low surface, try to find a way to stabilize 3 out of 4 of your limbs. You can place a hand or elbow on your thigh or rest a knee on the surface you are reaching across. This will provide you more stability, so that your muscles do not tire as quickly. By keeping your knees relaxed, or slightly bent, you will also reduce stress across your lower back. CORRECT LIFTING TECHNIQUES DO :  Assume a wide stance. This will provide you more stability and the opportunity to get as close as possible to the object which you are lifting.  Tense your abdominals to brace your spine. Bend at the knees and hips. Keeping your back locked in a neutral-spine position, lift using your leg muscles. Lift with your legs, keeping your back straight.  Test the weight of unknown objects before attempting to lift them.  Try to keep your elbows locked down at your sides in order get the best strength from your shoulders when carrying an object.  Always ask for help when lifting heavy or awkward objects. INCORRECT LIFTING TECHNIQUES DO NOT:   Lock your knees when lifting, even if it is a small object.  Bend and twist. Pivot at your feet or move your feet when needing to change directions.  Assume that you can safely pick up even a paperclip without proper posture. Document Released: 03/07/2005 Document Revised: 05/30/2011 Document Reviewed: 06/19/2008 [employer] Patient Information 2015 [employer]. This information is not intended to replace advice given to you by your health care provider. Make sure you discuss any questions you have with your health care provider.  

## 2014-09-23 NOTE — Progress Notes (Signed)
Patient ID: Tyrone Giovanniwayne Hefley Sr., male   DOB: 08-13-1965, 49 y.o.   MRN: [id number]  Assessment and Plan:  Hypertension:  -Continue medication,  -monitor blood pressure at home.  -Continue DASH diet.   -Reminder to go to the ER if any CP, SOB, nausea, dizziness, severe HA, changes vision/speech, left arm numbness and tingling, and jaw pain.  Cholesterol: -Continue diet and exercise.  -Check cholesterol.   Pre-diabetes: -Continue diet and exercise.  -Check A1C  Vitamin D Def: -check level -continue medications.   Low back pain -mobic -flexeril  Right knee pain -likely arthritis -mobic  Continue diet and meds as discussed. Further disposition pending results of labs.  HPI 49 y.o. male  presents for 3 month follow up with hypertension, hyperlipidemia, prediabetes and vitamin D.   His blood pressure has been controlled at home, today their BP is BP: 140/68 mmHg.   He does workout.  He reports that he is trying to make a habit out of working out.   He denies chest pain, shortness of breath, dizziness.   He is on cholesterol medication and denies myalgias. His cholesterol is not at goal. The cholesterol last visit was:   Lab Results  Component Value Date   CHOL 164 05/22/2014   HDL 26* 05/22/2014   LDLCALC 117* 05/22/2014   TRIG 107 05/22/2014   CHOLHDL 6.3 05/22/2014     He has been working on diet and exercise for prediabetes, and denies foot ulcerations, hyperglycemia, hypoglycemia , increased appetite, nausea, paresthesia of the feet, polydipsia, polyuria, visual disturbances, vomiting and weight loss. Last A1C in the office was:  Lab Results  Component Value Date   HGBA1C 7.1* 05/22/2014    Patient is on Vitamin D supplement.  Lab Results  Component Value Date   VD25OH 37 05/22/2014     He reports that his back and his right knee are bothering.  He has no specific injury to his back or knees.  He reports that ibuprofen has not been helping much.  He reports that  the right side of his lower back.  He reports that it is painful.  He takes ibuprofen maybe once weekly.    Current Medications:  Current Outpatient Prescriptions on File Prior to Visit  Medication Sig Dispense Refill  . glucose blood (FREESTYLE TEST STRIPS) test strip Use to test blood sugar once daily. Dx code: 250.00 100 each 3  . Lancets (FREESTYLE) lancets Use to test blood sugar once daily. Dx code: 250.00 100 each 3  . metFORMIN (GLUCOPHAGE-XR) 500 MG 24 hr tablet take 2 tablets by mouth twice a day with meals 120 tablet 6   No current facility-administered medications on file prior to visit.    Medical History:  Past Medical History  Diagnosis Date  . Depression   . OCD (obsessive compulsive disorder)   . Hypogonadism male   . Vitamin D deficiency   . Diabetes mellitus without complication     Allergies:  Allergies  Allergen Reactions  . Zoloft [Sertraline Hcl]     ED     Review of Systems:  Review of Systems  Constitutional: Positive for weight loss. Negative for fever, malaise/fatigue and diaphoresis.  HENT: Negative for congestion, ear discharge, nosebleeds and sore throat.   Eyes: Negative.   Respiratory: Negative for cough, shortness of breath and wheezing.   Cardiovascular: Negative for chest pain, palpitations and leg swelling.  Gastrointestinal: Negative for heartburn, nausea, vomiting, abdominal pain, diarrhea, constipation, blood in stool and melena.  Genitourinary: Negative.   Musculoskeletal: Positive for joint pain.  Skin: Negative.   Neurological: Negative for dizziness, sensory change, loss of consciousness and headaches.  Psychiatric/Behavioral: Negative for depression and suicidal ideas. The patient is not nervous/anxious and does not have insomnia.     Family history- Review and unchanged  Social history- Review and unchanged  Physical Exam: BP 140/68 mmHg  Pulse 70  Temp(Src) 98.2 F (36.8 C) (Temporal)  Resp 18  Ht 6' 2.5" (1.892 m)   Wt 286 lb (129.729 kg)  BMI 36.24 kg/m2 Wt Readings from Last 3 Encounters:  09/23/14 286 lb (129.729 kg)  05/22/14 314 lb (142.429 kg)  02/17/14 306 lb 6.4 oz (138.982 kg)    General Appearance: Well nourished well developed, in no apparent distress. Eyes: PERRLA, EOMs, conjunctiva no swelling or erythema ENT/Mouth: Ear canals normal without obstruction, swelling, erythma, discharge.  TMs normal bilaterally.  Oropharynx moist, clear, without exudate, or postoropharyngeal swelling. Neck: Supple, thyroid normal,no cervical adenopathy  Respiratory: Respiratory effort normal, Breath sounds clear A&P without rhonchi, wheeze, or rale.  No retractions, no accessory usage. Cardio: RRR with no MRGs. Brisk peripheral pulses without edema.  Abdomen: Soft, + BS,  Non tender, no guarding, rebound, hernias, masses. Musculoskeletal: Full ROM, 5/5 strength, Normal gait, Patient rises slowly from sitting to standing.  They walk without an antalgic gait.  There is no evidence of erythema, ecchymosis, or gross deformity.  There is tenderness to palpation over the right lower lumbar spine.  NO bony tenderness to palpation.  Active ROM is full but mildly painful.  Sensation to light touch is intact over all extremities.  Strength is symmetric and equal in all extremities.  Skin: Warm, dry without rashes, lesions, ecchymosis. Pain around the right jointlines with no ligamentous abnormalities.  Full ROM.  No crepitus Neuro: Awake and oriented X 3, Cranial nerves intact. Normal muscle tone, no cerebellar symptoms. Psych: Normal affect, Insight and Judgment appropriate.    FORCUCCI, Doneshia Hill, PA-C 4:31 PM [facility]

## 2014-09-24 LAB — BASIC METABOLIC PANEL WITH GFR
BUN: 11 mg/dL (ref 6–23)
CHLORIDE: 102 meq/L (ref 96–112)
CO2: 27 meq/L (ref 19–32)
CREATININE: 1.13 mg/dL (ref 0.50–1.35)
Calcium: 9 mg/dL (ref 8.4–10.5)
GFR, EST AFRICAN AMERICAN: 87 mL/min
GFR, Est Non African American: 75 mL/min
GLUCOSE: 105 mg/dL — AB (ref 70–99)
POTASSIUM: 4.1 meq/L (ref 3.5–5.3)
Sodium: 138 mEq/L (ref 135–145)

## 2014-09-24 LAB — CBC WITH DIFFERENTIAL/PLATELET
Basophils Absolute: 0 10*3/uL (ref 0.0–0.1)
Basophils Relative: 0 % (ref 0–1)
EOS ABS: 0.2 10*3/uL (ref 0.0–0.7)
EOS PCT: 3 % (ref 0–5)
HEMATOCRIT: 39.2 % (ref 39.0–52.0)
Hemoglobin: 13.5 g/dL (ref 13.0–17.0)
Lymphocytes Relative: 33 % (ref 12–46)
Lymphs Abs: 2.2 10*3/uL (ref 0.7–4.0)
MCH: 27.9 pg (ref 26.0–34.0)
MCHC: 34.4 g/dL (ref 30.0–36.0)
MCV: 81 fL (ref 78.0–100.0)
MONOS PCT: 7 % (ref 3–12)
MPV: 9.4 fL (ref 8.6–12.4)
Monocytes Absolute: 0.5 10*3/uL (ref 0.1–1.0)
Neutro Abs: 3.8 10*3/uL (ref 1.7–7.7)
Neutrophils Relative %: 57 % (ref 43–77)
Platelets: 345 10*3/uL (ref 150–400)
RBC: 4.84 MIL/uL (ref 4.22–5.81)
RDW: 15.7 % — AB (ref 11.5–15.5)
WBC: 6.6 10*3/uL (ref 4.0–10.5)

## 2014-09-24 LAB — INSULIN, RANDOM: INSULIN: 34.1 u[IU]/mL — AB (ref 2.0–19.6)

## 2014-09-24 LAB — HEPATIC FUNCTION PANEL
ALBUMIN: 4.1 g/dL (ref 3.5–5.2)
ALK PHOS: 46 U/L (ref 39–117)
ALT: 21 U/L (ref 0–53)
AST: 16 U/L (ref 0–37)
Bilirubin, Direct: 0.1 mg/dL (ref 0.0–0.3)
Indirect Bilirubin: 0.3 mg/dL (ref 0.2–1.2)
TOTAL PROTEIN: 6.6 g/dL (ref 6.0–8.3)
Total Bilirubin: 0.4 mg/dL (ref 0.2–1.2)

## 2014-09-24 LAB — LIPID PANEL
CHOL/HDL RATIO: 5.3 ratio
Cholesterol: 174 mg/dL (ref 0–200)
HDL: 33 mg/dL — ABNORMAL LOW (ref >=40)
LDL CALC: 117 mg/dL — AB (ref 0–99)
Triglycerides: 122 mg/dL (ref <150)
VLDL: 24 mg/dL (ref 0–40)

## 2014-09-24 LAB — TSH: TSH: 1.917 u[IU]/mL (ref 0.350–4.500)

## 2014-09-24 LAB — HEMOGLOBIN A1C
HEMOGLOBIN A1C: 6.2 % — AB (ref <5.7)
MEAN PLASMA GLUCOSE: 131 mg/dL — AB (ref <117)

## 2014-09-24 LAB — VITAMIN D 25 HYDROXY (VIT D DEFICIENCY, FRACTURES): Vit D, 25-Hydroxy: 51 ng/mL (ref 30–100)

## 2014-09-24 LAB — MAGNESIUM: Magnesium: 1.9 mg/dL (ref 1.5–2.5)

## 2014-12-29 ENCOUNTER — Ambulatory Visit: Payer: Self-pay | Admitting: Internal Medicine

## 2015-03-04 ENCOUNTER — Encounter: Payer: Self-pay | Admitting: Internal Medicine

## 2015-03-04 ENCOUNTER — Ambulatory Visit (INDEPENDENT_AMBULATORY_CARE_PROVIDER_SITE_OTHER): Payer: 59 | Admitting: Internal Medicine

## 2015-03-04 VITALS — BP 146/86 | HR 68 | Temp 97.3°F | Resp 16 | Ht 74.25 in | Wt 299.0 lb

## 2015-03-04 DIAGNOSIS — I10 Essential (primary) hypertension: Secondary | ICD-10-CM

## 2015-03-04 DIAGNOSIS — Z0001 Encounter for general adult medical examination with abnormal findings: Secondary | ICD-10-CM

## 2015-03-04 DIAGNOSIS — E349 Endocrine disorder, unspecified: Secondary | ICD-10-CM

## 2015-03-04 DIAGNOSIS — E1122 Type 2 diabetes mellitus with diabetic chronic kidney disease: Secondary | ICD-10-CM

## 2015-03-04 DIAGNOSIS — E782 Mixed hyperlipidemia: Secondary | ICD-10-CM

## 2015-03-04 DIAGNOSIS — Z0000 Encounter for general adult medical examination without abnormal findings: Secondary | ICD-10-CM

## 2015-03-04 DIAGNOSIS — Z79899 Other long term (current) drug therapy: Secondary | ICD-10-CM

## 2015-03-04 DIAGNOSIS — E559 Vitamin D deficiency, unspecified: Secondary | ICD-10-CM

## 2015-03-04 DIAGNOSIS — R5383 Other fatigue: Secondary | ICD-10-CM

## 2015-03-04 DIAGNOSIS — Z1212 Encounter for screening for malignant neoplasm of rectum: Secondary | ICD-10-CM

## 2015-03-04 DIAGNOSIS — Z125 Encounter for screening for malignant neoplasm of prostate: Secondary | ICD-10-CM

## 2015-03-04 LAB — CBC WITH DIFFERENTIAL/PLATELET
Basophils Absolute: 0 10*3/uL (ref 0.0–0.1)
Basophils Relative: 0 % (ref 0–1)
EOS ABS: 0.2 10*3/uL (ref 0.0–0.7)
EOS PCT: 3 % (ref 0–5)
HCT: 41.5 % (ref 39.0–52.0)
Hemoglobin: 14.4 g/dL (ref 13.0–17.0)
LYMPHS ABS: 2.7 10*3/uL (ref 0.7–4.0)
Lymphocytes Relative: 41 % (ref 12–46)
MCH: 27.9 pg (ref 26.0–34.0)
MCHC: 34.7 g/dL (ref 30.0–36.0)
MCV: 80.3 fL (ref 78.0–100.0)
MONO ABS: 0.6 10*3/uL (ref 0.1–1.0)
MONOS PCT: 9 % (ref 3–12)
MPV: 9.3 fL (ref 8.6–12.4)
Neutro Abs: 3.1 10*3/uL (ref 1.7–7.7)
Neutrophils Relative %: 47 % (ref 43–77)
PLATELETS: 366 10*3/uL (ref 150–400)
RBC: 5.17 MIL/uL (ref 4.22–5.81)
RDW: 15.3 % (ref 11.5–15.5)
WBC: 6.6 10*3/uL (ref 4.0–10.5)

## 2015-03-04 LAB — VITAMIN B12: Vitamin B-12: 529 pg/mL (ref 211–911)

## 2015-03-04 LAB — TSH: TSH: 2.782 u[IU]/mL (ref 0.350–4.500)

## 2015-03-04 MED ORDER — LISINOPRIL 20 MG PO TABS: ORAL_TABLET | ORAL | Status: DC

## 2015-03-04 NOTE — Progress Notes (Signed)
Patient ID: Tyrone Giovanniwayne Guidroz Sr., male   DOB: [birth year], 49 y.o.   MRN: [id number]  Annual  Screening/Preventative Visit And Comprehensive Evaluation & Examination  This very nice 49 y.o. DBM presents for presents for a Wellness/Preventative Visit & comprehensive evaluation and management of multiple medical co-morbidities.  Patient has been followed for HTN, T2_NIDDM, Hyperlipidemia and Vitamin D Deficiency. Other problems include OCD.    Patient has hx/o labile HTN predates since 2011. Today's BP is elevated at 146/86 by the nurse and higher by my check at 170/105 by wrist & wall cuffs. Patient denies any cardiac symptoms as chest pain, palpitations, shortness of breath, dizziness or ankle swelling.   Patient's hyperlipidemia is not controlled with diet. Patient denies myalgias or other medication SE's. Last lipids were not at goal with Cholesterol 174; HDL 33*; LDL 117*; Triglycerides 122 on 09/23/2014.   Patient has Morbid Obesity (BMI 38+) and consequent  T2_NIDDM  since Jan 2014 and patient denies reactive hypoglycemic symptoms, visual blurring, diabetic polys or paresthesias. Last A1c was 6.2% on 09/23/2014.    Patient also has low T of 203 in 2011. Finally, patient has history of Vitamin D Deficiency of 36 in 2011 and last vitamin D was 09/23/2014: Vit D, 25-Hydroxy 51    Medication Sig  . metFORMIN -XR 500 MG 24 hr tablet take 2 tablets by mouth twice a day with meals   Allergies  Allergen Reactions  . Zoloft [Sertraline Hcl]     ED   Past Medical History  Diagnosis Date  . Depression   . OCD (obsessive compulsive disorder)   . Hypogonadism male   . Vitamin D deficiency   . Diabetes mellitus without complication [facility](HCC)    Health Maintenance  Topic Date Due  . PNEUMOCOCCAL POLYSACCHARIDE VACCINE (1) 09/12/1966  . OPHTHALMOLOGY EXAM  09/12/1974  . TETANUS/TDAP  09/12/1983  . COLONOSCOPY  09/12/2014  . INFLUENZA VACCINE  10/20/2014  . FOOT EXAM  02/18/2015  . URINE MICROALBUMIN   02/18/2015  . HEMOGLOBIN A1C  03/26/2015  . HIV Screening  Completed   Immunization History  Administered Date(s) Administered  . Influenza-Unspecified 01/27/2011, 03/31/2014   No past surgical history on file. Family History  Problem Relation Age of Onset  . Hypertension Mother   . Hyperlipidemia Mother   . Hypertension Father   . Diabetes Father   . Hypertension Brother   . Diabetes Brother   . Cancer Maternal Grandfather   . Cancer Maternal Uncle     Social History   Social History  . Marital Status: Divorced    Spouse Name: N/A  . Number of Children: 1 daughter & 3 sons  . Years of Education: N/A   Occupational History  . 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 Smoking status: Never Smoker   . Smokeless tobacco: Not on file  . Alcohol Use: No  . Drug Use: No  . Sexual Activity: Active    ROS Constitutional: Denies fever, chills, weight loss/gain, headaches, insomnia,  night sweats or change in appetite. Does c/o fatigue. Eyes: Denies redness, blurred vision, diplopia, discharge, itchy or watery eyes.  ENT: Denies discharge, congestion, post nasal drip, epistaxis, sore throat, earache, hearing loss, dental pain, Tinnitus, Vertigo, Sinus pain or snoring.  Cardio: Denies chest pain, palpitations, irregular heartbeat, syncope, dyspnea, diaphoresis, orthopnea, PND, claudication or edema Respiratory: denies cough, dyspnea, DOE, pleurisy, hoarseness, laryngitis or wheezing.  Gastrointestinal: Denies dysphagia, heartburn, reflux, water brash, pain, cramps, nausea,  vomiting, bloating, diarrhea, constipation, hematemesis, melena, hematochezia, jaundice or hemorrhoids Genitourinary: Denies dysuria, frequency, urgency, nocturia, hesitancy, discharge, hematuria or flank pain Musculoskeletal: Denies arthralgia, myalgia, stiffness, Jt. Swelling, pain, limp or strain/sprain. Denies Falls. Skin: Denies puritis, rash, hives, warts, acne, eczema or change in skin  lesion Neuro: No weakness, tremor, incoordination, spasms, paresthesia or pain Psychiatric: Denies confusion, memory loss or sensory loss. Denies Depression. Endocrine: Denies change in weight, skin, hair change, nocturia, and paresthesia, diabetic polys, visual blurring or hyper / hypo glycemic episodes.  Heme/Lymph: No excessive bleeding, bruising or enlarged lymph nodes.  Physical Exam  BP 146/86 mmHg  Pulse 68  Temp(Src) 97.3 F (36.3 C)  Resp 16  Ht 6' 2.25" (1.886 m)  Wt 299 lb (135.626 kg)  BMI 38.13 kg/m2   BP rechecked at 170/105 by wrist & wall cuff  General Appearance: Well nourished, in no apparent distress. Eyes: PERRLA, EOMs, conjunctiva no swelling or erythema, normal fundi and vessels. Sinuses: No frontal/maxillary tenderness ENT/Mouth: EACs patent / TMs  nl. Nares clear without erythema, swelling, mucoid exudates. Oral hygiene is good. No erythema, swelling, or exudate. Tongue normal, non-obstructing. Tonsils not swollen or erythematous. Hearing normal.  Neck: Supple, thyroid normal. No bruits, nodes or JVD. Respiratory: Respiratory effort normal.  BS equal and clear bilateral without rales, rhonci, wheezing or stridor. Cardio: Heart sounds are normal with regular rate and rhythm and no murmurs, rubs or gallops. Peripheral pulses are normal and equal bilaterally without edema. No aortic or femoral bruits. Chest: symmetric with normal excursions and percussion.  Abdomen: Flat, soft, with bowl sounds. Nontender, no guarding, rebound, hernias, masses, or organomegaly.  Lymphatics: Non tender without lymphadenopathy.  Genitourinary: No hernias.Testes nl. DRE - prostate nl for age - smooth & firm w/o nodules. Musculoskeletal: Full ROM all peripheral extremities, joint stability, 5/5 strength, and normal gait. Skin: Warm and dry without rashes, lesions, cyanosis, clubbing or  ecchymosis.  Neuro: Cranial nerves intact, reflexes equal bilaterally. Normal muscle tone, no  cerebellar symptoms. Sensation intact.  Pysch: Alert and oriented X 3 with normal affect, insight and judgment appropriate. Unique affect.   Assessment and Plan  1. Annual Preventative/Screening Exam   - Microalbumin / creatinine urine ratio - EKG 12-Lead - [place], RETROPERITNL ABD,  LTD - POC Hemoccult Bld/Stl ( - Urinalysis, Routine w reflex microscopic - Vitamin B12 - Iron and TIBC - PSA - Testosterone - HM DIABETES FOOT EXAM - LOW EXTREMITY NEUR EXAM DOCUM - CBC with Differential/Platelet - BASIC METABOLIC PANEL WITH GFR - Hepatic function panel - Magnesium - Lipid panel - TSH - Hemoglobin A1c - Insulin, random - VITAMIN D 25 Hydroxy  - lisinopril  20 MG tablet; Take 1 tablet daily for Kidney Protection & BP  Dispense: 90 tablet; Refill: 1  2. Essential hypertension  - EKG 12-Lead - [place], RETROPERITNL ABD,  LTD - TSH - lisinopril  20 MG tablet; Take 1 tablet daily for Kidney Protection & BP  Dispense: 90 tablet; Refill: 1  3. Mixed hyperlipidemia  - Lipid panel - TSH  4. Type 2 diabetes mellitus with chronic kidney disease, without long-term current use of insulin, unspecified CKD stage (HCC)  - Microalbumin / creatinine urine ratio - HM DIABETES FOOT EXAM - LOW EXTREMITY NEUR EXAM DOCUM - Hemoglobin A1c - Insulin, random - lisinopril (PRINIVIL,ZESTRIL) 20 MG tablet; Take 1 tablet daily for Kidney Protection & BP  Dispense: 90 tablet; Refill: 1  5. Vitamin D deficiency  - VITAMIN D 25 Hydroxy  6. Morbid obesity, unspecified obesity type (HCC)   7. Testosterone deficiency  - Testosterone  8. Screening for rectal cancer  - POC Hemoccult Bld/Stl   9. Prostate cancer screening  - PSA  10. Other fatigue  - Vitamin B12 - Iron and TIBC - Testosterone  11. Medication management  - Urinalysis, Routine w reflex microscopic  - CBC with Differential/Platelet - BASIC METABOLIC PANEL WITH GFR - Hepatic function panel - Magnesium   Continue prudent  diet as discussed, weight control, BP monitoring, regular exercise, and medications as discussed.  Discussed med effects and SE's. Routine screening labs and tests as requested with regular follow-up as recommended. Over 40 minutes of exam, counseling, chart review and high complex critical decision making was performed

## 2015-03-04 NOTE — Patient Instructions (Signed)

## 2015-03-05 ENCOUNTER — Other Ambulatory Visit: Payer: Self-pay | Admitting: Internal Medicine

## 2015-03-05 DIAGNOSIS — E782 Mixed hyperlipidemia: Secondary | ICD-10-CM

## 2015-03-05 LAB — MAGNESIUM: MAGNESIUM: 2.1 mg/dL (ref 1.5–2.5)

## 2015-03-05 LAB — HEPATIC FUNCTION PANEL
ALBUMIN: 4.4 g/dL (ref 3.6–5.1)
ALT: 32 U/L (ref 9–46)
AST: 23 U/L (ref 10–35)
Alkaline Phosphatase: 50 U/L (ref 40–115)
BILIRUBIN TOTAL: 0.4 mg/dL (ref 0.2–1.2)
Bilirubin, Direct: 0.1 mg/dL (ref <=0.2)
Indirect Bilirubin: 0.3 mg/dL (ref 0.2–1.2)
TOTAL PROTEIN: 7.2 g/dL (ref 6.1–8.1)

## 2015-03-05 LAB — BASIC METABOLIC PANEL WITH GFR
BUN: 15 mg/dL (ref 7–25)
CO2: 27 mmol/L (ref 20–31)
CREATININE: 1.21 mg/dL (ref 0.70–1.33)
Calcium: 9.5 mg/dL (ref 8.6–10.3)
Chloride: 99 mmol/L (ref 98–110)
GFR, EST AFRICAN AMERICAN: 80 mL/min (ref >=60)
GFR, Est Non African American: 69 mL/min (ref >=60)
Glucose, Bld: 87 mg/dL (ref 65–99)
Potassium: 4.3 mmol/L (ref 3.5–5.3)
Sodium: 135 mmol/L (ref 135–146)

## 2015-03-05 LAB — URINALYSIS, ROUTINE W REFLEX MICROSCOPIC
Bilirubin Urine: NEGATIVE
Glucose, UA: NEGATIVE
Hgb urine dipstick: NEGATIVE
KETONES UR: NEGATIVE
Leukocytes, UA: NEGATIVE
NITRITE: NEGATIVE
PH: 5.5 (ref 5.0–8.0)
Protein, ur: NEGATIVE
SPECIFIC GRAVITY, URINE: 1.009 (ref 1.001–1.035)

## 2015-03-05 LAB — MICROALBUMIN / CREATININE URINE RATIO
CREATININE, URINE: 73 mg/dL (ref 20–370)
Microalb, Ur: <0.2 mg/dL

## 2015-03-05 LAB — VITAMIN D 25 HYDROXY (VIT D DEFICIENCY, FRACTURES): VIT D 25 HYDROXY: 54 ng/mL (ref 30–100)

## 2015-03-05 LAB — IRON AND TIBC
%SAT: 22 % (ref 15–60)
IRON: 85 ug/dL (ref 50–180)
TIBC: 389 ug/dL (ref 250–425)
UIBC: 304 ug/dL (ref 125–400)

## 2015-03-05 LAB — LIPID PANEL
Cholesterol: 194 mg/dL (ref 125–200)
HDL: 32 mg/dL — AB (ref >=40)
LDL Cholesterol: 135 mg/dL — ABNORMAL HIGH (ref <130)
TRIGLYCERIDES: 136 mg/dL (ref <150)
Total CHOL/HDL Ratio: 6.1 Ratio — ABNORMAL HIGH (ref <=5.0)
VLDL: 27 mg/dL (ref <30)

## 2015-03-05 LAB — PSA: PSA: 0.92 ng/mL (ref <=4.00)

## 2015-03-05 LAB — HEMOGLOBIN A1C
Hgb A1c MFr Bld: 6.5 % — ABNORMAL HIGH (ref <5.7)
MEAN PLASMA GLUCOSE: 140 mg/dL — AB (ref <117)

## 2015-03-05 LAB — TESTOSTERONE: Testosterone: 320 ng/dL (ref 300–890)

## 2015-03-05 LAB — INSULIN, RANDOM: INSULIN: 10.6 u[IU]/mL (ref 2.0–19.6)

## 2015-03-05 MED ORDER — ATORVASTATIN CALCIUM 80 MG PO TABS: ORAL_TABLET | ORAL | Status: AC

## 2015-03-30 ENCOUNTER — Other Ambulatory Visit: Payer: Self-pay | Admitting: *Deleted

## 2015-03-30 DIAGNOSIS — Z0001 Encounter for general adult medical examination with abnormal findings: Secondary | ICD-10-CM

## 2015-03-30 DIAGNOSIS — Z1212 Encounter for screening for malignant neoplasm of rectum: Secondary | ICD-10-CM

## 2015-03-30 LAB — POC HEMOCCULT BLD/STL (HOME/3-CARD/SCREEN)
Card #2 Fecal Occult Blod, POC: NEGATIVE
FECAL OCCULT BLD: NEGATIVE
FECAL OCCULT BLD: NEGATIVE

## 2015-06-02 ENCOUNTER — Ambulatory Visit: Payer: Self-pay | Admitting: Internal Medicine

## 2015-06-10 ENCOUNTER — Ambulatory Visit (INDEPENDENT_AMBULATORY_CARE_PROVIDER_SITE_OTHER): Payer: 59 | Admitting: Internal Medicine

## 2015-06-10 VITALS — BP 122/70 | HR 67 | Temp 97.9°F | Wt 292.0 lb

## 2015-06-10 DIAGNOSIS — E785 Hyperlipidemia, unspecified: Secondary | ICD-10-CM

## 2015-06-10 DIAGNOSIS — I10 Essential (primary) hypertension: Secondary | ICD-10-CM | POA: Diagnosis not present

## 2015-06-10 DIAGNOSIS — E119 Type 2 diabetes mellitus without complications: Secondary | ICD-10-CM | POA: Diagnosis not present

## 2015-06-10 DIAGNOSIS — Z79899 Other long term (current) drug therapy: Secondary | ICD-10-CM | POA: Diagnosis not present

## 2015-06-10 LAB — CBC WITH DIFFERENTIAL/PLATELET
BASOS PCT: 0 % (ref 0–1)
Basophils Absolute: 0 10*3/uL (ref 0.0–0.1)
Eosinophils Absolute: 0.2 10*3/uL (ref 0.0–0.7)
Eosinophils Relative: 3 % (ref 0–5)
HCT: 39.1 % (ref 39.0–52.0)
HEMOGLOBIN: 13.6 g/dL (ref 13.0–17.0)
Lymphocytes Relative: 35 % (ref 12–46)
Lymphs Abs: 2.2 10*3/uL (ref 0.7–4.0)
MCH: 28.5 pg (ref 26.0–34.0)
MCHC: 34.8 g/dL (ref 30.0–36.0)
MCV: 81.8 fL (ref 78.0–100.0)
MPV: 8.9 fL (ref 8.6–12.4)
Monocytes Absolute: 0.6 10*3/uL (ref 0.1–1.0)
Monocytes Relative: 9 % (ref 3–12)
NEUTROS ABS: 3.4 10*3/uL (ref 1.7–7.7)
NEUTROS PCT: 53 % (ref 43–77)
PLATELETS: 331 10*3/uL (ref 150–400)
RBC: 4.78 MIL/uL (ref 4.22–5.81)
RDW: 15 % (ref 11.5–15.5)
WBC: 6.4 10*3/uL (ref 4.0–10.5)

## 2015-06-10 LAB — BASIC METABOLIC PANEL WITH GFR
BUN: 12 mg/dL (ref 7–25)
CALCIUM: 9.6 mg/dL (ref 8.6–10.3)
CHLORIDE: 103 mmol/L (ref 98–110)
CO2: 27 mmol/L (ref 20–31)
Creat: 0.93 mg/dL (ref 0.70–1.33)
GFR, Est African American: >89 mL/min (ref >=60)
Glucose, Bld: 94 mg/dL (ref 65–99)
Potassium: 4.5 mmol/L (ref 3.5–5.3)
SODIUM: 138 mmol/L (ref 135–146)

## 2015-06-10 LAB — HEPATIC FUNCTION PANEL
ALT: 24 U/L (ref 9–46)
AST: 14 U/L (ref 10–35)
Albumin: 4.2 g/dL (ref 3.6–5.1)
Alkaline Phosphatase: 52 U/L (ref 40–115)
BILIRUBIN DIRECT: 0.1 mg/dL (ref <=0.2)
BILIRUBIN INDIRECT: 0.2 mg/dL (ref 0.2–1.2)
BILIRUBIN TOTAL: 0.3 mg/dL (ref 0.2–1.2)
Total Protein: 6.8 g/dL (ref 6.1–8.1)

## 2015-06-10 LAB — TSH: TSH: 2.06 mIU/L (ref 0.40–4.50)

## 2015-06-10 LAB — LIPID PANEL
CHOL/HDL RATIO: 4.8 ratio (ref <=5.0)
Cholesterol: 119 mg/dL — ABNORMAL LOW (ref 125–200)
HDL: 25 mg/dL — ABNORMAL LOW (ref >=40)
LDL CALC: 69 mg/dL (ref <130)
TRIGLYCERIDES: 123 mg/dL (ref <150)
VLDL: 25 mg/dL (ref <30)

## 2015-06-10 MED ORDER — FREESTYLE SYSTEM KIT: 1.0000 | PACK | Status: AC | PRN

## 2015-06-10 NOTE — Progress Notes (Signed)
Assessment and Plan:  Hypertension:  -Continue medication,  -monitor blood pressure at home.  -Continue DASH diet.   -Reminder to go to the ER if any CP, SOB, nausea, dizziness, severe HA, changes vision/speech, left arm numbness and tingling, and jaw pain.  Cholesterol: -continued weight loss -Continue diet and exercise.  -Check cholesterol.   Pre-diabetes: -Continue diet and exercise.  -Check A1C  Vitamin D Def: -check level -continue medications.   OCD -placed on medication from  Continue diet and meds as discussed. Further disposition pending results of labs.  HPI 50 y.o. male  presents for 3 month follow up with hypertension, hyperlipidemia, prediabetes and vitamin D.   His blood pressure has been controlled at home, today their BP is  .   He does workout. He denies chest pain, shortness of breath, dizziness.   He is on cholesterol medication and denies myalgias. His cholesterol is at goal. The cholesterol last visit was:   Lab Results  Component Value Date   CHOL 194 03/04/2015   HDL 32* 03/04/2015   LDLCALC 135* 03/04/2015   TRIG 136 03/04/2015   CHOLHDL 6.1* 03/04/2015     He has been working on diet and exercise for prediabetes, and denies foot ulcerations, hyperglycemia, hypoglycemia , increased appetite, nausea, paresthesia of the feet, polydipsia, polyuria, visual disturbances, vomiting and weight loss. Last A1C in the office was:  Lab Results  Component Value Date   HGBA1C 6.5* 03/04/2015  He has not been checking his blood sugar at home.  He has been having issues with the glucometer.    Patient is on Vitamin D supplement.  Lab Results  Component Value Date   VD25OH 54 03/04/2015     He does report frequently urinating.  He reports that he drinks 10 cups of water per day.  During the summer he drinks a lot more.  He does drink up to 130-120 oz of water.    Current Medications:  Current Outpatient Prescriptions on File Prior to Visit  Medication Sig  Dispense Refill  . atorvastatin (LIPITOR) 80 MG tablet Take 1/2 to 1 tablet daily or as directed for Cholesterol 30 tablet 2  . glucose blood (FREESTYLE TEST STRIPS) test strip Use to test blood sugar once daily. Dx code: 250.00 100 each 3  . Lancets (FREESTYLE) lancets Use to test blood sugar once daily. Dx code: 250.00 100 each 3  . lisinopril (PRINIVIL,ZESTRIL) 20 MG tablet Take 1 tablet daily for Kidney Protection & BP 90 tablet 1  . metFORMIN (GLUCOPHAGE-XR) 500 MG 24 hr tablet take 2 tablets by mouth twice a day with meals 120 tablet 6   No current facility-administered medications on file prior to visit.    Medical History:  Past Medical History  Diagnosis Date  . Depression   . OCD (obsessive compulsive disorder)   . Hypogonadism male   . Vitamin D deficiency   . Diabetes mellitus without complication (HCC)     Allergies:  Allergies  Allergen Reactions  . Zoloft [Sertraline Hcl]     ED     Review of Systems:  Review of Systems  Constitutional: Negative for fever, chills and malaise/fatigue.  HENT: Negative for congestion, ear discharge, ear pain, nosebleeds and sore throat.   Eyes: Negative.   Respiratory: Negative for cough, shortness of breath and wheezing.   Cardiovascular: Negative for chest pain, palpitations and leg swelling.  Gastrointestinal: Negative for heartburn, abdominal pain, diarrhea, constipation, blood in stool and melena.  Genitourinary: Negative for  dysuria, urgency and frequency.  Neurological: Negative for dizziness, sensory change, loss of consciousness and headaches.  Psychiatric/Behavioral: Negative for depression. The patient is not nervous/anxious and does not have insomnia.     Family history- Review and unchanged  Social history- Review and unchanged  Physical Exam: There were no vitals taken for this visit. Wt Readings from Last 3 Encounters:  03/04/15 299 lb (135.626 kg)  09/23/14 286 lb (129.729 kg)  05/22/14 314 lb (142.429 kg)     General Appearance: Well nourished well developed, in no apparent distress. Eyes: PERRLA, EOMs, conjunctiva no swelling or erythema ENT/Mouth: Ear canals normal without obstruction, swelling, erythma, discharge.  TMs normal bilaterally.  Oropharynx moist, clear, without exudate, or postoropharyngeal swelling. Neck: Supple, thyroid normal,no cervical adenopathy  Respiratory: Respiratory effort normal, Breath sounds clear A&P without rhonchi, wheeze, or rale.  No retractions, no accessory usage. Cardio: RRR with no MRGs. Brisk peripheral pulses without edema.  Abdomen: Soft, + BS,  Non tender, no guarding, rebound, hernias, masses. Musculoskeletal: Full ROM, 5/5 strength, Normal gait Skin: Warm, dry without rashes, lesions, ecchymosis.  Neuro: Awake and oriented X 3, Cranial nerves intact. Normal muscle tone, no cerebellar symptoms. Psych: Normal affect, Insight and Judgment appropriate.    Terri Piedra, PA-C 2:09 PM [facility]

## 2015-06-11 LAB — HEMOGLOBIN A1C
HEMOGLOBIN A1C: 6.9 % — AB (ref <5.7)
MEAN PLASMA GLUCOSE: 151 mg/dL — AB (ref <117)

## 2015-09-09 ENCOUNTER — Ambulatory Visit: Payer: Self-pay | Admitting: Internal Medicine

## 2015-09-28 ENCOUNTER — Other Ambulatory Visit: Payer: Self-pay | Admitting: *Deleted

## 2015-09-28 MED ORDER — METFORMIN HCL ER 500 MG PO TB24: ORAL_TABLET | ORAL | Status: DC

## 2015-10-06 ENCOUNTER — Encounter: Payer: Self-pay | Admitting: Internal Medicine

## 2015-10-06 ENCOUNTER — Ambulatory Visit (INDEPENDENT_AMBULATORY_CARE_PROVIDER_SITE_OTHER): Payer: 59 | Admitting: Internal Medicine

## 2015-10-06 VITALS — BP 124/82 | HR 80 | Temp 96.3°F | Resp 16 | Ht 74.5 in | Wt 281.8 lb

## 2015-10-06 DIAGNOSIS — N390 Urinary tract infection, site not specified: Secondary | ICD-10-CM

## 2015-10-06 DIAGNOSIS — E782 Mixed hyperlipidemia: Secondary | ICD-10-CM | POA: Diagnosis not present

## 2015-10-06 DIAGNOSIS — E559 Vitamin D deficiency, unspecified: Secondary | ICD-10-CM | POA: Diagnosis not present

## 2015-10-06 DIAGNOSIS — IMO0001 Reserved for inherently not codable concepts without codable children: Secondary | ICD-10-CM

## 2015-10-06 DIAGNOSIS — R030 Elevated blood-pressure reading, without diagnosis of hypertension: Secondary | ICD-10-CM | POA: Diagnosis not present

## 2015-10-06 DIAGNOSIS — Z79899 Other long term (current) drug therapy: Secondary | ICD-10-CM | POA: Diagnosis not present

## 2015-10-06 DIAGNOSIS — E119 Type 2 diabetes mellitus without complications: Secondary | ICD-10-CM | POA: Insufficient documentation

## 2015-10-06 DIAGNOSIS — N3281 Overactive bladder: Secondary | ICD-10-CM

## 2015-10-06 LAB — CBC WITH DIFFERENTIAL/PLATELET
BASOS ABS: 0 {cells}/uL (ref 0–200)
Basophils Relative: 0 %
EOS ABS: 120 {cells}/uL (ref 15–500)
EOS PCT: 2 %
HCT: 40.4 % (ref 38.5–50.0)
Hemoglobin: 13.3 g/dL (ref 13.2–17.1)
LYMPHS PCT: 33 %
Lymphs Abs: 1980 cells/uL (ref 850–3900)
MCH: 27.9 pg (ref 27.0–33.0)
MCHC: 32.9 g/dL (ref 32.0–36.0)
MCV: 84.7 fL (ref 80.0–100.0)
MONOS PCT: 7 %
MPV: 9.4 fL (ref 7.5–12.5)
Monocytes Absolute: 420 cells/uL (ref 200–950)
NEUTROS ABS: 3480 {cells}/uL (ref 1500–7800)
NEUTROS PCT: 58 %
PLATELETS: 350 10*3/uL (ref 140–400)
RBC: 4.77 MIL/uL (ref 4.20–5.80)
RDW: 13.9 % (ref 11.0–15.0)
WBC: 6 10*3/uL (ref 3.8–10.8)

## 2015-10-06 LAB — TSH: TSH: 1.54 m[IU]/L (ref 0.40–4.50)

## 2015-10-06 MED ORDER — SOLIFENACIN SUCCINATE 5 MG PO TABS: 5.0000 mg | ORAL_TABLET | Freq: Every day | ORAL | Status: DC

## 2015-10-06 NOTE — Patient Instructions (Signed)
Neurogenic Bladder     (or Overactive Bladder)    Neurogenic bladder is a bladder control disorder. It is caused by problems with the nerves that control your bladder. This condition may make your bladder overactive or underactive. You may have trouble holding your urine or passing your urine (urinating). The bladder is a hollow organ in the lower part of your abdomen. It stores urine after the urine is made by your kidneys. Normally, when your bladder is not full, the muscles that control your bladder are relaxed. When your bladder fills with urine, nerve signals are sent to your brain, indicating that your bladder is full. Your brain then sends signals through your spinal cord to the muscles in your bladder that start and stop urine flow. If you have neurogenic bladder, the nerves and muscles do not work together the way they should. CAUSES  Any kind of nerve damage or condition that disrupts the signals from your brain to your bladder can cause neurogenic bladder. Many things can cause these nerve problems. They include:  A disease that affects the nervous system, such as:  Alzheimer disease.  Cerebral palsy.  Multiple sclerosis.  Diabetes.  Parkinson disease.  Damage to your brain or spinal cord from:  Trauma.  Tumor.  Infection.  Surgery.  Alcohol abuse.  Stroke.  A spinal cord birth defect. RISK FACTORS Having nerve damage or a nerve disorder puts you at risk for neurogenic bladder. SIGNS AND SYMPTOMS  Signs and symptoms of neurogenic bladder include:  Leaking or gushing urine (incontinence).  A sudden, strong urge to pass urine (urgency).  Frequent urination during the day and night.  Being unable to empty your bladder completely (urinary retention).  Frequent urinary tract infections. DIAGNOSIS  Your health care provider may diagnose neurogenic bladder based on your symptoms and medical history. A physical exam will also be done. You may be asked to keep a  record of your bladder symptoms and the times that you urinate (bladder diary). Your health care provider may also do several tests to help diagnose neurogenic bladder, including:  A urine test to check for infection.  A bladder scan after you urinate to see how much urine is left in your bladder.  Various tests to measure your urine flow and see how well the flow is controlled (urodynamic tests).  A procedure that involves using a tool that is like a very thin telescope to look through your urethra into your bladder (cystoscopy). A health care provider who specializes in the urinary tract (urologist) may do this test.  Imaging tests of your brain or spine. TREATMENT  Treatment depends on the cause of your neurogenic bladder and the symptoms you are having. Work closely with your health care provider to find the treatments that will improve your quality of life. Treatment options include:  Learning ways to control when you urinate, such as:  Urinating at scheduled times.  Training yourself to delay urination.  Doing exercises (Kegel exercises) to strengthen the muscles that control urine flow.  Avoiding foods or drinks that make your symptoms worse.  Taking medicines to:  Stimulate an underactive bladder.  Relax an overactive bladder.  Treat a urinary tract infection.  Learning how to use a thin tube (catheter) to empty your bladder. A catheter is a hollow tube that you pass through your urethra.  Procedures to stimulate the nerves that control your bladder.  Surgical procedures if other treatments do not help. HOME CARE INSTRUCTIONS   Keep a  bladder diary to find out which foods, liquids, or activities make your symptoms worse.  Use your bladder diary to schedule bathroom trips. If you are away from home, plan to be near a bathroom when your schedule says you need one.  Do Kegel exercises to strengthen the muscles that control urination. These muscles are the ones you use  to try to hold urine when you need to go. To do Kegel exercises:  Squeeze these muscles tight and hold for about 10 seconds.  Repeat three times.  Do these exercises often during the day when you do not have to urinate.  Limit your drinking of beverages that stimulate urination. These include soda, coffee, and tea.  After urinating, wait a few minutes and try again (double voiding).  Make sure you urinate just before you leave the house and just before you go to bed.  Take medicines only as directed by your health care provider.  Keep all follow-up visits as directed by your health care provider. This is important. SEEK MEDICAL CARE IF:   You are having a hard time controlling your symptoms.  Your symptoms are getting worse.  You have signs of a urinary tract infection:  A burning feeling when you urinate.  Chills.  Fever. SEEK IMMEDIATE MEDICAL CARE IF:  You cannot pass urine.    +++++++++++++++++++++++++++++++++++++++++++++++++  Recommend Adult Low Dose Aspirin or   coated  Aspirin 81 mg daily   To reduce risk of Colon Cancer 20 %,   Skin Cancer 26 % ,   Melanoma 46%   and   Pancreatic cancer 60%   ++++++++++++++++++++++++++++++++++++++++++++++++++++++ Vitamin D goal   is between 70-100.   Please make sure that you are taking your Vitamin D as directed.   It is very important as a natural anti-inflammatory   helping hair, skin, and nails, as well as reducing stroke and heart attack risk.   It helps your bones and helps with mood.  It also decreases numerous cancer risks so please take it as directed.   Low Vit D is associated with a 200-300% higher risk for CANCER   and 200-300% higher risk for HEART   ATTACK  &  STROKE.   .....................................Marland Kitchen  It is also associated with higher death rate at younger ages,   autoimmune diseases like Rheumatoid arthritis, Lupus, Multiple Sclerosis.     Also many other serious conditions, like  depression, Alzheimer's  Dementia, infertility, muscle aches, fatigue, fibromyalgia - just to name a few.  ++++++++++++++++++++++++++++++++++++++++++++++++  Recommend the book "The END of DIETING" by Dr Monico Hoar   & the book "The END of DIABETES " by Dr Monico Hoar  At [email or web address] - get book & Audio CD's     Being diabetic has a  300% increased risk for heart attack, stroke, cancer, and alzheimer- type vascular dementia. It is very important that you work harder with diet by avoiding all foods that are white. Avoid white rice (brown & wild rice is OK), white potatoes (sweetpotatoes in moderation is OK), White bread or wheat bread or anything made out of white flour like bagels, donuts, rolls, buns, biscuits, cakes, pastries, cookies, pizza crust, and pasta (made from white flour & egg whites) - vegetarian pasta or spinach or wheat pasta is OK. Multigrain breads like Arnold's or Pepperidge [employer], or multigrain sandwich thins or flatbreads.  Diet, exercise and weight loss can reverse and cure diabetes in the early stages.  Diet, exercise and weight loss is very important  in the control and prevention of complications of diabetes which affects every system in your body, ie. Brain - dementia/stroke, eyes - glaucoma/blindness, heart - heart attack/heart failure, kidneys - dialysis, stomach - gastric paralysis, intestines - malabsorption, nerves - severe painful neuritis, circulation - gangrene & loss of a leg(s), and finally cancer and Alzheimers.    I recommend avoid fried & greasy foods,  sweets/candy, white rice (brown or wild rice or Quinoa is OK), white potatoes (sweet potatoes are OK) - anything made from white flour - bagels, doughnuts, rolls, buns, biscuits,white and wheat breads, pizza crust and traditional pasta made of white flour & egg white(vegetarian pasta or spinach or wheat pasta is OK).  Multi-grain bread is OK - like multi-grain flat bread or sandwich thins. Avoid alcohol in excess.  Exercise is also important.    Eat all the vegetables you want - avoid meat, especially red meat and dairy - especially cheese.  Cheese is the most concentrated form of trans-fats which is the worst thing to clog up our arteries. Veggie cheese is OK which can be found in the fresh produce section at [employer] or [employer] or [employer]  ++++++++++++++++++++++++++++++++++++++++++++++++++ DASH Eating Plan  DASH stands for "Dietary Approaches to Stop Hypertension."   The DASH eating plan is a healthy eating plan that has been shown to reduce high blood pressure (hypertension). Additional health benefits may include reducing the risk of type 2 diabetes mellitus, heart disease, and stroke. The DASH eating plan may also help with weight loss.  WHAT DO I NEED TO KNOW ABOUT THE DASH EATING PLAN?  For the DASH eating plan, you will follow these general guidelines:  Choose foods with a percent daily value for sodium of less than 5% (as listed on the food label).  Use salt-free seasonings or herbs instead of table salt or sea salt.  Check with your health care provider or pharmacist before using salt substitutes.  Eat lower-sodium products, often labeled as "lower sodium" or "no salt added."  Eat fresh foods.  Eat more vegetables, fruits, and low-fat dairy products.    Choose whole grains. Look for the word "whole" as the first word in the ingredient list.  Choose fish   Limit sweets, desserts, sugars, and sugary drinks.  Choose heart-healthy fats.  Eat veggie cheese   Eat more home-cooked food and less restaurant, buffet, and fast food.  Limit fried foods.  Cook foods using methods other than frying.  Limit canned vegetables. If you do use them, rinse them well to decrease the sodium.  When eating at a restaurant, ask that your food be prepared with less salt, or no salt if possible.                      WHAT FOODS CAN I EAT?  Read Dr Francis DowseJoel Fuhrman's books on The End of  Dieting & The End of Diabetes  Grains  Whole grain or whole wheat bread. Brown rice. Whole grain or whole wheat pasta. Quinoa, bulgur, and whole grain cereals. Low-sodium cereals. Corn or whole wheat flour tortillas. Whole grain cornbread. Whole grain crackers. Low-sodium crackers.  Vegetables  Fresh or frozen vegetables (raw, steamed, roasted, or grilled). Low-sodium or reduced-sodium tomato and vegetable juices. Low-sodium or reduced-sodium tomato sauce and paste. Low-sodium or reduced-sodium canned vegetables.   Fruits  All fresh, canned (in natural juice), or frozen fruits.  Protein Products   All fish and seafood.  Dried beans, peas, or lentils. Unsalted  nuts and seeds. Unsalted canned beans.  Dairy  Low-fat dairy products, such as skim or 1% milk, 2% or reduced-fat cheeses, low-fat ricotta or cottage cheese, or plain low-fat yogurt. Low-sodium or reduced-sodium cheeses.  Fats and Oils  Tub margarines without trans fats. Light or reduced-fat mayonnaise and salad dressings (reduced sodium). Avocado. Safflower, olive, or canola oils. Natural peanut or almond butter.  Other  Unsalted popcorn and pretzels. The items listed above may not be a complete list of recommended foods or beverages. Contact your dietitian for more options.  +++++++++++++++++++++++++++++++++++++++++++  WHAT FOODS ARE NOT RECOMMENDED?  Grains/ White flour or wheat flour  White bread. White pasta. White rice. Refined cornbread. Bagels and croissants. Crackers that contain trans fat.  Vegetables  Creamed or fried vegetables. Vegetables in a . Regular canned vegetables. Regular canned tomato sauce and paste. Regular tomato and vegetable juices.  Fruits  Dried fruits. Canned fruit in light or heavy syrup. Fruit juice.  Meat and Other Protein Products  Meat in general - RED mwaet & White meat.  Fatty cuts of meat. Ribs, chicken wings, bacon, sausage, bologna, salami, chitterlings, fatback, hot dogs,  bratwurst, and packaged luncheon meats.  Dairy  Whole or 2% milk, cream, half-and-half, and cream cheese. Whole-fat or sweetened yogurt. Full-fat cheeses or blue cheese. Nondairy creamers and whipped toppings. Processed cheese, cheese spreads, or cheese curds.  Condiments  Onion and garlic salt, seasoned salt, table salt, and sea salt. Canned and packaged gravies. Worcestershire sauce. Tartar sauce. Barbecue sauce. Teriyaki sauce. Soy sauce, including reduced sodium. Steak sauce. Fish sauce. Oyster sauce. Cocktail sauce. Horseradish. Ketchup and mustard. Meat flavorings and tenderizers. Bouillon cubes. Hot sauce. Tabasco sauce. Marinades. Taco seasonings. Relishes.  Fats and Oils Butter, stick margarine, lard, shortening and bacon fat. Coconut, palm kernel, or palm oils. Regular salad dressings.  Pickles and olives. Salted popcorn and pretzels.  The items listed above may not be a complete list of foods and beverages to avoid.

## 2015-10-06 NOTE — Progress Notes (Signed)
Patient ID: Tyrone Giovanniwayne Mezo Sr., male   DOB: 15-Oct-1965, 50 y.o.   MRN: [id number]  [facility]                       Lucky CowboyWilliam Brockton Mckesson, M.D.        Dyanne CarrelAmanda R. Steffanie Dunnollier, P.A.-C       Terri Piedraourtney Forcucci, P.A.-C   [facility]                [address]                [place], [place] [id number] Telephone [phone] Telefax [phone] ______________________________________________________________________     This very nice 50 y.o. DBM presents for  follow up with Hypertension, Hyperlipidemia, Pre-Diabetes and Vitamin D Deficiency. Today he also relates that [facility] "[facility]". Today he also reports frequent urinations up to 12 x/daily , nocturia 2-4 x/nite , and sx's consistent with OAB as urge incontinance.     Patient has hx/o labile HTN monitored expectantly circa 2011 & takes meds sporadically & BP has been controlled at home. Today's BP: 124/82 mmHg. Patient has had no complaints of any cardiac type chest pain, palpitations, dyspnea/orthopnea/PND, dizziness, claudication, or dependent edema.     Hyperlipidemia is controlled in the past with diet & meds.  Last Lipids were at goal when on his meds  with Cholesterol 119*; HDL 25*; LDL Cholesterol 69; Triglycerides 123 on 06/10/2015. Today, he relates that he has been off of his Atorvastatin for about a month.      Also, the patient has history of T2_NIDDM circa 11/2013 and has had no symptoms of reactive hypoglycemia, diabetic polys, paresthesias or visual blurring.  He admits very infrequent monitoring of his CBG's. Last A1c was 6.9% on 06/10/2015.      Further, the patient also has history of Vitamin D Deficiency  Of "36" in 2011 and supplements vitamin D sporadically without any suspected side-effects. Last vitamin D was  54 on 03/04/2015.     Medication Sig  . metFORMIN-XR 500 MG  take 2 tablets by mouth twice a day with meals -  just restarted 4 days ago    Vit D 5,000 units  Takes 1 cap daily (sporadically)   . atorvastatin  80 MG Take 1/2 to 1 tablet daily or as directed for Cholesterol (Patient not taking: Reported on 10/06/2015)  . lisinopril  20 MG  Take 1 tablet daily for Kidney Protection & BP   Allergies  Allergen Reactions  . Zoloft [Sertraline Hcl] ED   PMHx:   Past Medical History  Diagnosis Date  . Depression   . OCD (obsessive compulsive disorder)   . Hypogonadism male   . Vitamin D deficiency   . Diabetes mellitus without complication (HCC)    Immunization History  Administered Date(s) Administered  . Influenza-Unspecified 01/27/2011, 03/31/2014   No past surgical history on file.  FHx:    Reviewed / unchanged  SHx:    Reviewed / unchanged  Systems Review:  Constitutional: Denies fever, chills, wt changes, headaches, insomnia, fatigue, night sweats, change in appetite. Eyes: Denies redness, blurred vision, diplopia, discharge, itchy, watery eyes.  ENT: Denies discharge, congestion, post nasal drip, epistaxis, sore throat, earache, hearing loss, dental pain, tinnitus, vertigo, sinus pain, snoring.  CV: Denies chest pain, palpitations, irregular heartbeat, syncope, dyspnea, diaphoresis, orthopnea, PND, claudication or edema. Respiratory: denies cough, dyspnea,  DOE, pleurisy, hoarseness, laryngitis, wheezing.  Gastrointestinal: Denies dysphagia, odynophagia, heartburn, reflux, water brash, abdominal pain or cramps, nausea, vomiting, bloating, diarrhea, constipation, hematemesis, melena, hematochezia  or hemorrhoids. Genitourinary: Denies dysuria, frequency, urgency, nocturia, hesitancy, discharge, hematuria or flank pain. Musculoskeletal: Denies arthralgias, myalgias, stiffness, jt. swelling, pain, limping or strain/sprain.  Skin: Denies pruritus, rash, hives, warts, acne, eczema or change in skin lesion(s). Neuro: No weakness, tremor, incoordination, spasms, paresthesia or pain. Psychiatric:  Denies confusion, memory loss or sensory loss. Endo: Denies change in weight, skin or hair change.  Heme/Lymph: No excessive bleeding, bruising or enlarged lymph nodes.  Physical Exam  BP 124/82 mmHg  Pulse 80  Temp(Src) 96.3 F (35.7 C)  Resp 16  Ht 6' 2.5" (1.892 m)  Wt 281 lb 12.8 oz (127.824 kg)  BMI 35.71 kg/m2  Appears well nourished and in no distress. Eyes: PERRLA, EOMs, conjunctiva no swelling or erythema. Sinuses: No frontal/maxillary tenderness ENT/Mouth: EAC's clear, TM's nl w/o erythema, bulging. Nares clear w/o erythema, swelling, exudates. Oropharynx clear without erythema or exudates. Oral hygiene is good. Tongue normal, non obstructing. Hearing intact.  Neck: Supple. Thyroid nl. Car 2+/2+ without bruits, nodes or JVD. Chest: Respirations nl with BS clear & equal w/o rales, rhonchi, wheezing or stridor.  Cor: Heart sounds normal w/ regular rate and rhythm without sig. murmurs, gallops, clicks, or rubs. Peripheral pulses normal and equal  without edema.  Abdomen: Soft & bowel sounds normal. Non-tender w/o guarding, rebound, hernias, masses, or organomegaly.  Lymphatics: Unremarkable.  Musculoskeletal: Full ROM all peripheral extremities, joint stability, 5/5 strength, and normal gait.  Skin: Warm, dry without exposed rashes, lesions or ecchymosis apparent.  Neuro: Cranial nerves intact, reflexes equal bilaterally. Sensory-motor testing grossly intact. Tendon reflexes grossly intact.  Pysch: Alert & oriented x 3.  Insight and judgement nl & appropriate. No ideations.  Assessment and Plan:  1. Elevated BP  - Continue medication, monitor blood pressure at home. Continue DASH diet. Reminder to go to the ER if any CP, SOB, nausea, dizziness, severe HA, changes vision/speech, left arm numbness and tingling and jaw pain.  - TSH  2. Mixed hyperlipidemia  - Continue diet/meds, exercise,& lifestyle modifications. Continue monitor periodic cholesterol/liver & renal  functions   - Lipid panel - TSH  3. Diabetes mellitus without complication (HCC)  - Continue diet, exercise, lifestyle modifications. Monitor appropriate labs. - Hemoglobin A1c - Insulin, random  4. Vitamin D deficiency  - Continue supplementation. - VITAMIN D 25 Hydroxy   5. Urinary tract infection, site not specified  - Urinalysis, Routine w reflex microscopic  - Urine culture  6. OAB (overactive bladder)  - Trial on solifenacin (VESICARE) 5 MG tablet; Take 1 tablet daily. (Samples)  Dispense: 5 tablet  7. Medication management  - CBC with Differential/Platelet - BASIC METABOLIC PANEL WITH GFR - Hepatic function panel - Magnesium   Recommended regular exercise, BP monitoring, weight control, and discussed med and SE's. Recommended labs to assess and monitor clinical status. Further disposition pending results of labs. Over 30 minutes of exam, counseling, chart review was performed

## 2015-10-07 ENCOUNTER — Encounter: Payer: Self-pay | Admitting: *Deleted

## 2015-10-07 LAB — BASIC METABOLIC PANEL WITH GFR
BUN: 13 mg/dL (ref 7–25)
CHLORIDE: 95 mmol/L — AB (ref 98–110)
CO2: 26 mmol/L (ref 20–31)
CREATININE: 1.07 mg/dL (ref 0.70–1.33)
Calcium: 9 mg/dL (ref 8.6–10.3)
GFR, Est African American: >89 mL/min (ref >=60)
GFR, Est Non African American: 80 mL/min (ref >=60)
Glucose, Bld: 445 mg/dL — ABNORMAL HIGH (ref 65–99)
Potassium: 4.5 mmol/L (ref 3.5–5.3)
SODIUM: 130 mmol/L — AB (ref 135–146)

## 2015-10-07 LAB — LIPID PANEL
CHOLESTEROL: 156 mg/dL (ref 125–200)
HDL: 26 mg/dL — AB (ref >=40)
LDL Cholesterol: 83 mg/dL (ref <130)
Total CHOL/HDL Ratio: 6 Ratio — ABNORMAL HIGH (ref <=5.0)
Triglycerides: 236 mg/dL — ABNORMAL HIGH (ref <150)
VLDL: 47 mg/dL — ABNORMAL HIGH (ref <30)

## 2015-10-07 LAB — URINALYSIS, ROUTINE W REFLEX MICROSCOPIC
Bilirubin Urine: NEGATIVE
HGB URINE DIPSTICK: NEGATIVE
Ketones, ur: NEGATIVE
LEUKOCYTES UA: NEGATIVE
NITRITE: NEGATIVE
PH: 6.5 (ref 5.0–8.0)
Protein, ur: NEGATIVE
Specific Gravity, Urine: 1.026 (ref 1.001–1.035)

## 2015-10-07 LAB — URINALYSIS, MICROSCOPIC ONLY
BACTERIA UA: NONE SEEN [HPF]
CASTS: NONE SEEN [LPF]
CRYSTALS: NONE SEEN [HPF]
RBC / HPF: NONE SEEN RBC/HPF (ref <=2)
SQUAMOUS EPITHELIAL / LPF: NONE SEEN [HPF] (ref <=5)
WBC, UA: NONE SEEN WBC/HPF (ref <=5)
Yeast: NONE SEEN [HPF]

## 2015-10-07 LAB — INSULIN, RANDOM: Insulin: 9.4 u[IU]/mL (ref 2.0–19.6)

## 2015-10-07 LAB — HEPATIC FUNCTION PANEL
ALBUMIN: 3.9 g/dL (ref 3.6–5.1)
ALT: 34 U/L (ref 9–46)
AST: 16 U/L (ref 10–35)
Alkaline Phosphatase: 73 U/L (ref 40–115)
BILIRUBIN DIRECT: 0.1 mg/dL (ref <=0.2)
BILIRUBIN TOTAL: 0.5 mg/dL (ref 0.2–1.2)
Indirect Bilirubin: 0.4 mg/dL (ref 0.2–1.2)
Total Protein: 6.7 g/dL (ref 6.1–8.1)

## 2015-10-07 LAB — HEMOGLOBIN A1C
HEMOGLOBIN A1C: 12.5 % — AB (ref <5.7)
Mean Plasma Glucose: 312 mg/dL

## 2015-10-07 LAB — MAGNESIUM: MAGNESIUM: 1.9 mg/dL (ref 1.5–2.5)

## 2015-10-07 LAB — VITAMIN D 25 HYDROXY (VIT D DEFICIENCY, FRACTURES): VIT D 25 HYDROXY: 37 ng/mL (ref 30–100)

## 2015-10-08 LAB — URINE CULTURE: Organism ID, Bacteria: NO GROWTH

## 2015-10-13 ENCOUNTER — Ambulatory Visit: Payer: Self-pay | Admitting: Internal Medicine

## 2015-10-16 ENCOUNTER — Ambulatory Visit: Payer: Self-pay | Admitting: Internal Medicine

## 2015-10-27 ENCOUNTER — Ambulatory Visit: Payer: Self-pay | Admitting: Internal Medicine

## 2015-12-08 ENCOUNTER — Ambulatory Visit: Payer: Self-pay | Admitting: Internal Medicine

## 2015-12-24 ENCOUNTER — Ambulatory Visit: Payer: Self-pay | Admitting: Internal Medicine

## 2016-03-10 ENCOUNTER — Encounter: Payer: Self-pay | Admitting: Internal Medicine

## 2016-03-15 ENCOUNTER — Ambulatory Visit (INDEPENDENT_AMBULATORY_CARE_PROVIDER_SITE_OTHER): Payer: 59

## 2016-03-15 ENCOUNTER — Ambulatory Visit (INDEPENDENT_AMBULATORY_CARE_PROVIDER_SITE_OTHER): Payer: 59 | Admitting: Physician Assistant

## 2016-03-15 VITALS — BP 138/82 | HR 97 | Temp 98.2°F | Resp 18 | Ht 74.5 in | Wt 281.0 lb

## 2016-03-15 DIAGNOSIS — R05 Cough: Secondary | ICD-10-CM

## 2016-03-15 DIAGNOSIS — G8929 Other chronic pain: Secondary | ICD-10-CM

## 2016-03-15 DIAGNOSIS — J069 Acute upper respiratory infection, unspecified: Secondary | ICD-10-CM

## 2016-03-15 DIAGNOSIS — M545 Low back pain: Secondary | ICD-10-CM

## 2016-03-15 DIAGNOSIS — R059 Cough, unspecified: Secondary | ICD-10-CM

## 2016-03-15 MED ORDER — FLUTICASONE PROPIONATE 50 MCG/ACT NA SUSP: 2.0000 | Freq: Every day | NASAL | 0 refills | Status: DC

## 2016-03-15 MED ORDER — CYCLOBENZAPRINE HCL 5 MG PO TABS: 5.0000 mg | ORAL_TABLET | Freq: Three times a day (TID) | ORAL | 0 refills | Status: DC | PRN

## 2016-03-15 MED ORDER — MELOXICAM 15 MG PO TABS: 15.0000 mg | ORAL_TABLET | Freq: Every day | ORAL | 1 refills | Status: DC

## 2016-03-15 MED ORDER — HYDROCOD POLST-CPM POLST ER 10-8 MG/5ML PO SUER: 5.0000 mL | Freq: Two times a day (BID) | ORAL | 0 refills | Status: DC | PRN

## 2016-03-15 MED ORDER — BENZONATATE 100 MG PO CAPS: 100.0000 mg | ORAL_CAPSULE | Freq: Three times a day (TID) | ORAL | 0 refills | Status: DC | PRN

## 2016-03-15 NOTE — Patient Instructions (Addendum)
- We will treat this as a respiratory viral infection.  - I recommend you rest, drink plenty of fluids, eat light meals including soups.  -You may use flonase for your nasal congestion.  - You may use cough syrup at night for your cough and sore throat, Tessalon pearls during the day. Be aware that cough syrup can definitely make you drowsy and sleepy so do not drive or operate any heavy machinery if it is affecting you during the day.  - You may also use Tylenol or ibuprofen over-the-counter for your sore throat.  - Please let me know if you are not seeing any improvement or get worse in 5-7 days.   In terms of back pain, I recommend resting today. However, tomorrow I would begin walking and moving around as much as tolerated. Begin stretching in a couple of days. The worse thing you can do for low back pain is lie in bed all day or sit down all day. Use medications as needed.   Just to know, flexeril can cause side effects that may impair your thinking or reactions. Be careful if you drive or do anything that requires you to be awake and alert. void drinking alcohol, which can increase some of the side effects of Flexeril.  NSAIDs like meloxicam have common side effects of heartburn, stomach pain, indigestion, and headache. Could lead to renal insufficiency, stroke, or GI bleed if taken excess amounts outside of what is recommended on label long term.    You should avoid heavy lifting or strenuous repetitive activity to prevent recurrence of event. Experiment with both ice and heat and choose whichever feels best for you.  Use heat pad or ice pack, do not apply directly to skin, use barrier such as towel over the skin. Leave on for 15-20 minutes, 3-4 times a day.  Please perform exercises below. Stretches are to be performed for 2 sets, holding 10-15 seconds each. Recommended to perform this rehab twice daily within pain tolerance for 2 weeks.   FLEXION RANGE OF MOTION AND STRETCHING  EXERCISES: STRETCH - Flexion, Single Knee to Chest   Lie on a firm bed or floor with both legs extended in front of you.  Keeping one leg in contact with the floor, bring your opposite knee to your chest. Hold your leg in place by either grabbing behind your thigh or at your knee.  Pull until you feel a gentle stretch in your lower back.   Slowly release your grasp and repeat the exercise with the opposite side.  STRETCH - Flexion, Double Knee to Chest   Lie on a firm bed or floor with both legs extended in front of you.  Keeping one leg in contact with the floor, bring your opposite knee to your chest.  Tense your stomach muscles to support your back and then lift your other knee to your chest. Hold your legs in place by either grabbing behind your thighs or at your knees.  Pull both knees toward your chest until you feel a gentle stretch in your lower back.   Tense your stomach muscles and slowly return one leg at a time to the floor.  STRETCH - Low Trunk Rotation  Lie on a firm bed or floor. Keeping your legs in front of you, bend your knees so they are both pointed toward the ceiling and your feet are flat on the floor.  Extend your arms out to the side. This will stabilize your upper body by keeping  your shoulders in contact with the floor.  Gently and slowly drop both knees together to one side until you feel a gentle stretch in your lower back.   Tense your stomach muscles to support your lower back as you bring your knees back to the starting position. Repeat the exercise to the other side.   EXTENSION RANGE OF MOTION AND FLEXIBILITY EXERCISES: STRETCH - Extension, Prone on Elbows   Lie on your stomach on the floor, a bed will be too soft. Place your palms about shoulder width apart and at the height of your head.  Place your elbows under your shoulders. If this is too painful, stack pillows under your chest.  Allow your body to relax so that your hips drop lower and  make contact more completely with the floor.  Slowly return to lying flat on the floor.  RANGE OF MOTION - Extension, Prone Press Ups  Lie on your stomach on the floor, a bed will be too soft. Place your palms about shoulder width apart and at the height of your head.  Keeping your back as relaxed as possible, slowly straighten your elbows while keeping your hips on the floor. You may adjust the placement of your hands to maximize your comfort. As you gain motion, your hands will come more underneath your shoulders.  Slowly return to lying flat on the floor.  RANGE OF MOTION- Quadruped, Neutral Spine   Assume a hands and knees position on a firm surface. Keep your hands under your shoulders and your knees under your hips. You may place padding under your knees for comfort.  Drop your head and point your tail bone toward the ground below you. This will round out your lower back like an angry cat.    Slowly lift your head and release your tail bone so that your back sags into a large arch, like an old horse.  Repeat this until you feel limber in your lower back.  Now, find your "sweet spot." This will be the most comfortable position somewhere between the two previous positions. This is your neutral spine. Once you have found this position, tense your stomach muscles to support your lower back.  STRENGTHENING EXERCISES - Low Back Strain These exercises may help you when beginning to rehabilitate your injury. These exercises should be done near your "sweet spot." This is the neutral, low-back arch, somewhere between fully rounded and fully arched, that is your least painful position. When performed in this safe range of motion, these exercises can be used for people who have either a flexion or extension based injury. These exercises may resolve your symptoms with or without further involvement from your physician, physical therapist or athletic trainer. While completing these exercises,  remember:   Muscles can gain both the endurance and the strength needed for everyday activities through controlled exercises.  Complete these exercises as instructed by your physician, physical therapist or athletic trainer. Increase the resistance and repetitions only as guided.  You may experience muscle soreness or fatigue, but the pain or discomfort you are trying to eliminate should never worsen during these exercises. If this pain does worsen, stop and make certain you are following the directions exactly. If the pain is still present after adjustments, discontinue the exercise until you can discuss the trouble with your caregiver.  STRENGTHENING - Deep Abdominals, Pelvic Tilt  Lie on a firm bed or floor. Keeping your legs in front of you, bend your knees so they are both pointed  toward the ceiling and your feet are flat on the floor.  Tense your lower abdominal muscles to press your lower back into the floor. This motion will rotate your pelvis so that your tail bone is scooping upwards rather than pointing at your feet or into the floor.  STRENGTHENING - Abdominals, Crunches   Lie on a firm bed or floor. Keeping your legs in front of you, bend your knees so they are both pointed toward the ceiling and your feet are flat on the floor. Cross your arms over your chest.  Slightly tip your chin down without bending your neck.  Tense your abdominals and slowly lift your trunk high enough to just clear your shoulder blades. Lifting higher can put excessive stress on the lower back and does not further strengthen your abdominal muscles.  Control your return to the starting position.  STRENGTHENING - Quadruped, Opposite UE/LE Lift   Assume a hands and knees position on a firm surface. Keep your hands under your shoulders and your knees under your hips. You may place padding under your knees for comfort.  Find your neutral spine and gently tense your abdominal muscles so that you can  maintain this position. Your shoulders and hips should form a rectangle that is parallel with the floor and is not twisted.  Keeping your trunk steady, lift your right hand no higher than your shoulder and then your left leg no higher than your hip. Make sure you are not holding your breath.   Continuing to keep your abdominal muscles tense and your back steady, slowly return to your starting position. Repeat with the opposite arm and leg.  STRENGTHENING - Lower Abdominals, Double Knee Lift  Lie on a firm bed or floor. Keeping your legs in front of you, bend your knees so they are both pointed toward the ceiling and your feet are flat on the floor.  Tense your abdominal muscles to brace your lower back and slowly lift both of your knees until they come over your hips. Be certain not to hold your breath.  POSTURE AND BODY MECHANICS CONSIDERATIONS - Low Back Strain Keeping correct posture when sitting, standing or completing your activities will reduce the stress put on different body tissues, allowing injured tissues a chance to heal and limiting painful experiences. The following are general guidelines for improved posture. Your physician or physical therapist will provide you with any instructions specific to your needs. While reading these guidelines, remember:  The exercises prescribed by your provider will help you have the flexibility and strength to maintain correct postures.  The correct posture provides the best environment for your joints to work. All of your joints have less wear and tear when properly supported by a spine with good posture. This means you will experience a healthier, less painful body.  Correct posture must be practiced with all of your activities, especially prolonged sitting and standing. Correct posture is as important when doing repetitive low-stress activities (typing) as it is when doing a single heavy-load activity (lifting). RESTING POSITIONS Consider which  positions are most painful for you when choosing a resting position. If you have pain with flexion-based activities (sitting, bending, stooping, squatting), choose a position that allows you to rest in a less flexed posture. You would want to avoid curling into a fetal position on your side. If your pain worsens with extension-based activities (prolonged standing, working overhead), avoid resting in an extended position such as sleeping on your stomach. Most people will find  more comfort when they rest with their spine in a more neutral position, neither too rounded nor too arched. Lying on a non-sagging bed on your side with a pillow between your knees, or on your back with a pillow under your knees will often provide some relief. Keep in mind, being in any one position for a prolonged period of time, no matter how correct your posture, can still lead to stiffness. PROPER SITTING POSTURE In order to minimize stress and discomfort on your spine, you must sit with correct posture. Sitting with good posture should be effortless for a healthy body. Returning to good posture is a gradual process. Many people can work toward this most comfortably by using various supports until they have the flexibility and strength to maintain this posture on their own. When sitting with proper posture, your ears will fall over your shoulders and your shoulders will fall over your hips. You should use the back of the chair to support your upper back. Your lower back will be in a neutral position, just slightly arched. You may place a small pillow or folded towel at the base of your lower back for support.  When working at a desk, create an environment that supports good, upright posture. Without extra support, muscles tire, which leads to excessive strain on joints and other tissues. Keep these recommendations in mind: CHAIR:  A chair should be able to slide under your desk when your back makes contact with the back of the chair.  This allows you to work closely.  The chair's height should allow your eyes to be level with the upper part of your monitor and your hands to be slightly lower than your elbows. BODY POSITION  Your feet should make contact with the floor. If this is not possible, use a foot rest.  Keep your ears over your shoulders. This will reduce stress on your neck and lower back. INCORRECT SITTING POSTURES  If you are feeling tired and unable to assume a healthy sitting posture, do not slouch or slump. This puts excessive strain on your back tissues, causing more damage and pain. Healthier options include:  Using more support, like a lumbar pillow.  Switching tasks to something that requires you to be upright or walking.  Talking a brief walk.  Lying down to rest in a neutral-spine position. PROLONGED STANDING WHILE SLIGHTLY LEANING FORWARD  When completing a task that requires you to lean forward while standing in one place for a long time, place either foot up on a stationary 2-4 inch high object to help maintain the best posture. When both feet are on the ground, the lower back tends to lose its slight inward curve. If this curve flattens (or becomes too large), then the back and your other joints will experience too much stress, tire more quickly, and can cause pain. CORRECT STANDING POSTURES Proper standing posture should be assumed with all daily activities, even if they only take a few moments, like when brushing your teeth. As in sitting, your ears should fall over your shoulders and your shoulders should fall over your hips. You should keep a slight tension in your abdominal muscles to brace your spine. Your tailbone should point down to the ground, not behind your body, resulting in an over-extended swayback posture.  INCORRECT STANDING POSTURES  Common incorrect standing postures include a forward head, locked knees and/or an excessive swayback. WALKING Walk with an upright posture. Your  ears, shoulders and hips should all line-up. PROLONGED  ACTIVITY IN A FLEXED POSITION When completing a task that requires you to bend forward at your waist or lean over a low surface, try to find a way to stabilize 3 out of 4 of your limbs. You can place a hand or elbow on your thigh or rest a knee on the surface you are reaching across. This will provide you more stability so that your muscles do not fatigue as quickly. By keeping your knees relaxed, or slightly bent, you will also reduce stress across your lower back. CORRECT LIFTING TECHNIQUES DO :   Assume a wide stance. This will provide you more stability and the opportunity to get as close as possible to the object which you are lifting.  Tense your abdominals to brace your spine. Bend at the knees and hips. Keeping your back locked in a neutral-spine position, lift using your leg muscles. Lift with your legs, keeping your back straight.  Test the weight of unknown objects before attempting to lift them.  Try to keep your elbows locked down at your sides in order get the best strength from your shoulders when carrying an object.  Always ask for help when lifting heavy or awkward objects. INCORRECT LIFTING TECHNIQUES DO NOT:   Lock your knees when lifting, even if it is a small object.  Bend and twist. Pivot at your feet or move your feet when needing to change directions.  Assume that you can safely pick up even a paper clip without proper posture.       IF you received an x-ray today, you will receive an invoice from [facility]. Please contact [facility] at [phone] with questions or concerns regarding your invoice.   IF you received labwork today, you will receive an invoice from [facility]. Please contact [facility] at [phone] with questions or concerns regarding your invoice.   Our billing staff will not be able to assist you with questions regarding bills from these companies.  You will be  contacted with the lab results as soon as they are available. The fastest way to get your results is to activate your My Chart account. Instructions are located on the last page of this paperwork. If you have not heard from us regarding the results in 2 weeks, please contact this office.

## 2016-03-15 NOTE — Progress Notes (Signed)
MRN: [id number] DOB: 11/08/1965  Subjective:   Tyrone Lamarche Sr. is a 50 y.o. male presenting for chief complaint of Cough (x1 week; not coughing up anything) and congestion (patient states he has been having congestion in his throat) . Reports 7 day history of sinus congestion, rhinorrhea and dry cough (no hemoptysis). Has tried tea, cough syrup, one mucinex pill with mild relief. Denies fever, sinus pain, ear fullness, ear pain, sore throat, wheezing and shortness of breath, night sweats, chills, fatigue, malaise, nausea, vomiting and diarrhea. Has not had any sick contact with any. No history of seasonal allergies or asthma. Patient has had flu shot this season. Denies smoking.  Pt would also like to talk about his back pain. He has had low pain x 3 months. It is mostly right sided in nature. He will have intermittent tingling sensation bilaterally. He works at [employer] and notes he is always big rolls of carpet. Denies acute injury,  urinary/bowel incontinence. He has tried ibuprofen and a muscle relaxer intermittently with moderate relief. He gets professional massages and does stretches at [employer] that do help. Note his back pain is chronic back pain x 20 years. He cannot remember if he has ever had a radiographic image of his back but he is requesting one today.   Tyrone Holloway has a current medication list which includes the following prescription(s): atorvastatin, glucose blood, glucose monitoring kit, freestyle, lisinopril, metformin, benzonatate, chlorpheniramine-hydrocodone, cyclobenzaprine, fluticasone, meloxicam, and solifenacin. Also is allergic to zoloft [sertraline hcl].  Tyrone Holloway  has a past medical history of Depression; Diabetes mellitus without complication ([place]yrone Holloway); Hypogonadism male; OCD (obsessive compulsive disorder); and Vitamin D deficiency. Also  has no past surgical history on file.   Objective:   Vitals: BP 138/82   Pulse 97   Temp 98.2 F (36.8 C) (Oral)    Resp 18   Ht 6' 2.5" (1.892 m)   Wt 281 lb (127.5 kg)   SpO2 99%   BMI 35.60 kg/m   Physical Exam  Constitutional: He is oriented to person, place, and time. He appears well-developed and well-nourished.  HENT:  Head: Normocephalic and atraumatic.  Right Ear: External ear and ear canal normal. Tympanic membrane is scarred.  Left Ear: Tympanic membrane, external ear and ear canal normal.  Nose: Mucosal edema and rhinorrhea present. Right sinus exhibits no maxillary sinus tenderness and no frontal sinus tenderness. Left sinus exhibits no maxillary sinus tenderness and no frontal sinus tenderness.  Mouth/Throat: Uvula is midline, oropharynx is clear and moist and mucous membranes are normal.  Eyes: Conjunctivae are normal.  Neck: Normal range of motion.  Cardiovascular: Normal rate, regular rhythm and normal heart sounds.   Pulmonary/Chest: Effort normal and breath sounds normal.  Musculoskeletal:       Thoracic back: He exhibits tenderness (right sided musculature). He exhibits normal range of motion and no bony tenderness.       Lumbar back: He exhibits tenderness (of left sided musculature). He exhibits normal range of motion and no bony tenderness.  Neurological: He is alert and oriented to person, place, and time.  Skin: Skin is warm and dry.  Psychiatric: He has a normal mood and affect.  Vitals reviewed.  Dg Thoracic Spine 2 View  Result Date: 03/15/2016 CLINICAL DATA:  Chronic back pain. EXAM: THORACIC SPINE 2 VIEWS COMPARISON:  None. FINDINGS: Curvature of the thoracic spine is convex towards the left. There is multi level disc space narrowing and ventral endplate spurring particularly within the upper  and mid thoracic spine compatible with degenerative disc disease. There is no fracture or subluxation. IMPRESSION: 1. No acute findings. 2. Scoliosis and multi level degenerative disc disease. Electronically Signed   By: Kerby Moors M.D.   On: 03/15/2016 18:08   Dg Lumbar Spine  Complete  Result Date: 03/15/2016 CLINICAL DATA:  50 year old male with history of chronic back pain, worsening over the past 3 months. EXAM: LUMBAR SPINE - COMPLETE 4+ VIEW COMPARISON:  No priors. FINDINGS: There is no evidence of lumbar spine fracture. Alignment is normal. Mild multilevel degenerative disc disease, most evident at T12-L1 and L1-L2. Mild multilevel facet arthropathy, most pronounced at L5-S1. IMPRESSION: 1. 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 No acute findings. Electronically Signed   By: Vinnie Langton M.D.   On: 03/15/2016 18:08      Assessment and Plan :  1. Acute upper respiratory infection Likely viral etiology, will treat symptomatically. Return to clinic in 5-7 days if no improvement, return sooner if symptoms worsen.  - fluticasone (FLONASE) 50 MCG/ACT nasal spray; Place 2 sprays into both nostrils daily.  Dispense: 16 g; Refill: 0  2. Cough - benzonatate (TESSALON) 100 MG capsule; Take 1-2 capsules (100-200 mg total) by mouth 3 (three) times daily as needed for cough.  Dispense: 40 capsule; Refill: 0 - chlorpheniramine-HYDROcodone (TUSSIONEX PENNKINETIC ER) 10-8 MG/5ML SUER; Take 5 mLs by mouth every 12 (twelve) hours as needed for cough.  Dispense: 100 mL; Refill: 0  3. Chronic bilateral low back pain, with sciatica presence unspecified -Likely strain complicated by multilevel degenerative changes. Recommend conservative treatment with heating pads, NSAIDs, and muscle relaxant for one week. He can use tylenol for pain as needed after this week. If no improvement with symptoms after one week of treatment, return to clinic.  - DG Lumbar Spine Complete; Future - DG Thoracic Spine 2 View; Future - meloxicam (MOBIC) 15 MG tablet; Take 1 tablet (15 mg total) by mouth daily.  Dispense: 30 tablet; Refill: 1 - cyclobenzaprine (FLEXERIL) 5 MG tablet; Take 1 tablet (5 mg total) by mouth 3 (three) times daily as needed for muscle spasms.   Dispense: 60 tablet; Refill: 0  Tenna Delaine, PA-C  [facility] 03/15/2016

## 2016-03-15 NOTE — Progress Notes (Deleted)
    MRN: [id number] DOB: [birth year]  Subjective:   Kahli Pacitti Sr. is a 50 y.o. male presenting for chief complaint of Cough (x1 week; not coughing up anything) and congestion (patient states he has been having congestion in his throat)    Drelyn has a current medication list which includes the following prescription(s): atorvastatin, glucose blood, glucose monitoring kit, freestyle, lisinopril, metformin, lisinopril, and solifenacin. Also is allergic to zoloft [sertraline hcl].  Antwain  has a past medical history of Depression; Diabetes mellitus without complication ([place]); Hypogonadism male; OCD (obsessive compulsive disorder); and Vitamin D deficiency. Also  has no past surgical history on file.  Objective:   Vitals: BP 138/82   Pulse 97   Temp 98.2 F (36.8 C) (Oral)   Resp 18   Ht 6' 2.5" (1.892 m)   Wt 281 lb (127.5 kg)   SpO2 99%   BMI 35.60 kg/m   Physical Exam  No results found for this or any previous visit (from the past 24 hour(s)).  Assessment and Plan :     Jaynee Eagles, PA-C [facility] [phone] 03/15/2016 4:46 PM

## 2016-03-30 ENCOUNTER — Other Ambulatory Visit: Payer: Self-pay | Admitting: Internal Medicine

## 2016-03-30 DIAGNOSIS — E782 Mixed hyperlipidemia: Secondary | ICD-10-CM

## 2016-03-30 DIAGNOSIS — I10 Essential (primary) hypertension: Secondary | ICD-10-CM

## 2016-03-30 DIAGNOSIS — Z0001 Encounter for general adult medical examination with abnormal findings: Secondary | ICD-10-CM

## 2016-03-30 DIAGNOSIS — E1122 Type 2 diabetes mellitus with diabetic chronic kidney disease: Secondary | ICD-10-CM

## 2016-04-24 ENCOUNTER — Other Ambulatory Visit: Payer: Self-pay | Admitting: Internal Medicine

## 2016-04-24 DIAGNOSIS — E1122 Type 2 diabetes mellitus with diabetic chronic kidney disease: Secondary | ICD-10-CM

## 2016-04-24 DIAGNOSIS — I10 Essential (primary) hypertension: Secondary | ICD-10-CM

## 2016-04-24 DIAGNOSIS — Z0001 Encounter for general adult medical examination with abnormal findings: Secondary | ICD-10-CM

## 2016-04-24 DIAGNOSIS — E782 Mixed hyperlipidemia: Secondary | ICD-10-CM

## 2016-04-25 ENCOUNTER — Ambulatory Visit: Payer: 59

## 2016-11-30 ENCOUNTER — Other Ambulatory Visit: Payer: Self-pay | Admitting: Internal Medicine

## 2016-11-30 ENCOUNTER — Other Ambulatory Visit: Payer: Self-pay | Admitting: *Deleted

## 2016-12-07 ENCOUNTER — Ambulatory Visit (INDEPENDENT_AMBULATORY_CARE_PROVIDER_SITE_OTHER): Payer: 59 | Admitting: Internal Medicine

## 2016-12-07 ENCOUNTER — Encounter: Payer: Self-pay | Admitting: Internal Medicine

## 2016-12-07 VITALS — BP 136/74 | HR 72 | Temp 97.5°F | Resp 18 | Ht 74.5 in | Wt 291.6 lb

## 2016-12-07 DIAGNOSIS — Z9119 Patient's noncompliance with other medical treatment and regimen: Secondary | ICD-10-CM

## 2016-12-07 DIAGNOSIS — I10 Essential (primary) hypertension: Secondary | ICD-10-CM | POA: Diagnosis not present

## 2016-12-07 DIAGNOSIS — Z91199 Patient's noncompliance with other medical treatment and regimen due to unspecified reason: Secondary | ICD-10-CM | POA: Insufficient documentation

## 2016-12-07 DIAGNOSIS — Z79899 Other long term (current) drug therapy: Secondary | ICD-10-CM

## 2016-12-07 DIAGNOSIS — E782 Mixed hyperlipidemia: Secondary | ICD-10-CM

## 2016-12-07 DIAGNOSIS — E559 Vitamin D deficiency, unspecified: Secondary | ICD-10-CM | POA: Diagnosis not present

## 2016-12-07 DIAGNOSIS — E119 Type 2 diabetes mellitus without complications: Secondary | ICD-10-CM

## 2016-12-07 NOTE — Patient Instructions (Signed)

## 2016-12-07 NOTE — Progress Notes (Signed)
This very nice 51 y.o. DBM  presents way over due with last OV in July 2017 and only presented for denied  refills month after repeated failure to make recommended appointments.  In the past, he has been followed forHypertension, Hyperlipidemia, T2_DM and Vitamin D Deficiency. Today he [facility] with last OV there sometime last year. He inconsistently claims he's had labs there and gets refills there. Today he asks for a not e stating he has PTSD as he's trying to get VA disability, but a [place] Psychiatrist would not diagnose with PTSD for disability purposes.      Patient has hx/o HTN circa 2011 & vaguely reports taking meds for BP. Today's BP is at goal 136/74. Patient has had no complaints of any cardiac type chest pain, palpitations, dyspnea/orthopnea/PND, dizziness, claudication, or dependent edema.     He has hx/o Hyperlipidemia  controlled with diet & meds lat year, but reports he's been off of cholesterol meds for a long time. Patient denies myalgias or other med SE's. Last Lipids were at goal albeit elevated Trig's:  Lab Results  Component Value Date   CHOL 156 10/06/2015   HDL 26 (L) 10/06/2015   LDLCALC 83 10/06/2015   TRIG 236 (H) 10/06/2015   CHOLHDL 6.0 (H) 10/06/2015      Also, the patient has history of  Morbid Obesity (BMI 36+) and T2_NIDDM since 2015 and last A1c was 12.5% in July 2017, but he alleges it has been much lower at the [place]. He denies ymptoms of reactive hypoglycemia, diabetic polys, paresthesias or visual blurring.     Further, the patient also has history of Vitamin D Deficiency ("36" / 2011) and supplements vitamin D without any suspected side-effects. Last vitamin D was still very low: Lab Results  Component Value Date   VD25OH 37 10/06/2015   Current Outpatient Prescriptions on File Prior to Visit  Medication Sig  . atorvastatin 80 MG tablet Says he's off  . cyclobenzaprine5 MG tablet Take 1 tab 3 x  daily as needed for muscle spasms.  . meloxicam  15 MG tablet Take 1 tablet (15 mg total) by mouth daily.  Marland Kitchen lisinopril  20 MG tablet Off   . metFORMIN-XR 500 MG  Patient not taking: Reported on 12/07/2016)   Allergies  Allergen Reactions  . Zoloft [Sertraline Hcl]     ED   PMHx:   Past Medical History:  Diagnosis Date  . Depression   . Diabetes mellitus without complication (HCC)   . Hypogonadism male   . OCD (obsessive compulsive disorder)   . Vitamin D deficiency    Immunization History  Administered Date(s) Administered  . Influenza-Unspecified 01/27/2011, 03/31/2014   No past surgical history on file. FHx:    Reviewed / unchanged  SHx:    Reviewed / unchanged   Systems Review:  Constitutional: Denies fever, chills, wt changes, headaches, insomnia, fatigue, night sweats, change in appetite. Eyes: Denies redness, blurred vision, diplopia, discharge, itchy, watery eyes.  ENT: Denies discharge, congestion, post nasal drip, epistaxis, sore throat, earache, hearing loss, dental pain, tinnitus, vertigo, sinus pain, snoring.  CV: Denies chest pain, palpitations, irregular heartbeat, syncope, dyspnea, diaphoresis, orthopnea, PND, claudication or edema. Respiratory: denies cough, dyspnea, DOE, pleurisy, hoarseness, laryngitis, wheezing.  Gastrointestinal: Denies dysphagia, odynophagia, heartburn, reflux, water brash, abdominal pain or cramps, nausea, vomiting, bloating, diarrhea, constipation, hematemesis, melena, hematochezia  or hemorrhoids. Genitourinary: Denies dysuria, frequency, urgency, nocturia,  hesitancy, discharge, hematuria or flank pain. Musculoskeletal: Denies arthralgias, myalgias, stiffness, jt. swelling, pain, limping or strain/sprain.  Skin: Denies pruritus, rash, hives, warts, acne, eczema or change in skin lesion(s). Neuro: No weakness, tremor, incoordination, spasms, paresthesia or pain. Psychiatric: Denies confusion, memory loss or sensory loss. Endo: Denies  change in weight, skin or hair change.  Heme/Lymph: No excessive bleeding, bruising or enlarged lymph nodes.  Physical Exam  BP 136/74   Pulse 72   Temp (!) 97.5 F (36.4 C)   Resp 18   Ht 6' 2.5" (1.892 m)   Wt 291 lb 9.6 oz (132.3 kg)   BMI 36.94 kg/m   Appears over nourished and in no distress.  Eyes: PERRLA, EOMs, conjunctiva no swelling or erythema. Sinuses: No frontal/maxillary tenderness ENT/Mouth: EAC's clear, TM's nl w/o erythema, bulging. Nares clear w/o erythema, swelling, exudates. Oropharynx clear without erythema or exudates. Oral hygiene is good. Tongue normal, non obstructing. Hearing intact.  Neck: Supple. Thyroid nl. Car 2+/2+ without bruits, nodes or JVD. Chest: Respirations nl with BS clear & equal w/o rales, rhonchi, wheezing or stridor.  Cor: Heart sounds normal w/ regular rate and rhythm without sig. murmurs, gallops, clicks or rubs. Peripheral pulses normal and equal  without edema.  Abdomen: Soft & bowel sounds normal. Non-tender w/o guarding, rebound, hernias, masses or organomegaly.  Lymphatics: Unremarkable.  Musculoskeletal: Full ROM all peripheral extremities, joint stability, 5/5 strength and normal gait.  Skin: Warm, dry without exposed rashes, lesions or ecchymosis apparent.  Neuro: Cranial nerves intact, reflexes equal bilaterally. Sensory-motor testing grossly intact. Tendon reflexes grossly intact.  Pysch: Alert & oriented x 3.  Insight and judgement nl & appropriate. No ideations.  Assessment and Plan:  1. Essential hypertension  - Continue medication, monitor blood pressure at home.  - Continue DASH diet. Reminder to go to the ER if any CP,  SOB, nausea, dizziness, severe HA, changes vision/speech.  2. Hyperlipidemia, mixed  - Continue diet/meds, exercise,& lifestyle modifications.  - Continue monitor periodic cholesterol/liver & renal functions   3. Diabetes mellitus without complication (HCC)  - Continue diet, exercise, lifestyle  modifications.  - Monitor appropriate labs.  - Hemoglobin A1c  4. Vitamin D deficiency  - Continue supplementation. - VITAMIN D 25 Hydroxy   5. Medication management  - Hemoglobin A1c - VITAMIN D 25 Hydroxy  6. Poor compliance  - discussed with patient to consider whether to f/u here or at the [place], but that in the future, we will not refill meds unless he keeps his appointments.         Discussed  regular exercise, BP monitoring, weight control to achieve/maintain BMI less than 25 and discussed med and SE's. Recommended labs to assess and monitor clinical status with further disposition pending results of labs. Over 30 minutes of exam, counseling, chart review was performed.

## 2016-12-08 LAB — HEMOGLOBIN A1C
HEMOGLOBIN A1C: 9 %{Hb} — AB (ref <5.7)
MEAN PLASMA GLUCOSE: 212 (calc)
eAG (mmol/L): 11.7 (calc)

## 2016-12-19 ENCOUNTER — Ambulatory Visit: Payer: Self-pay | Admitting: Internal Medicine

## 2017-01-16 ENCOUNTER — Other Ambulatory Visit: Payer: Self-pay | Admitting: Internal Medicine

## 2017-03-07 ENCOUNTER — Ambulatory Visit: Payer: Self-pay | Admitting: Internal Medicine

## 2017-04-25 ENCOUNTER — Encounter: Payer: Self-pay | Admitting: Internal Medicine

## 2018-04-06 IMAGING — DX DG THORACIC SPINE 2V
3 series · 3 of 3 positions shown · non-contrast
Comparison: None.

CLINICAL DATA: Chronic back pain.

EXAM:
THORACIC SPINE 2 VIEWS

[t-spine ap]
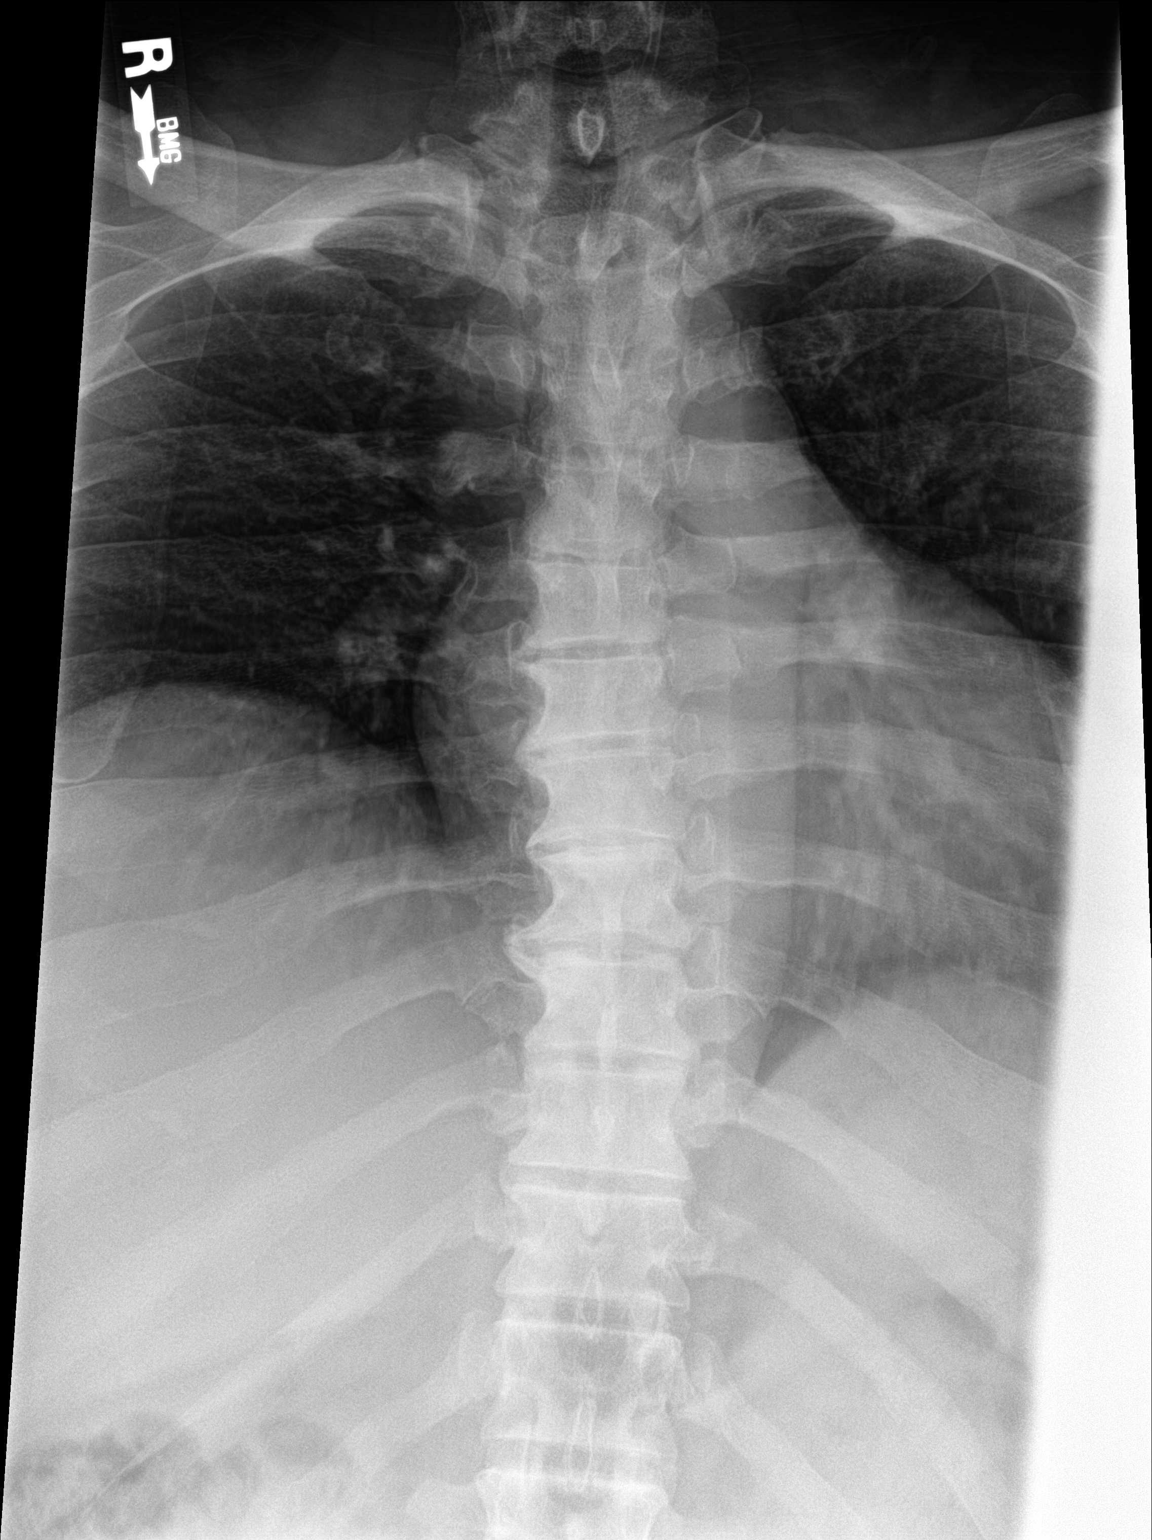

[t-spine lat (1 of 2)]
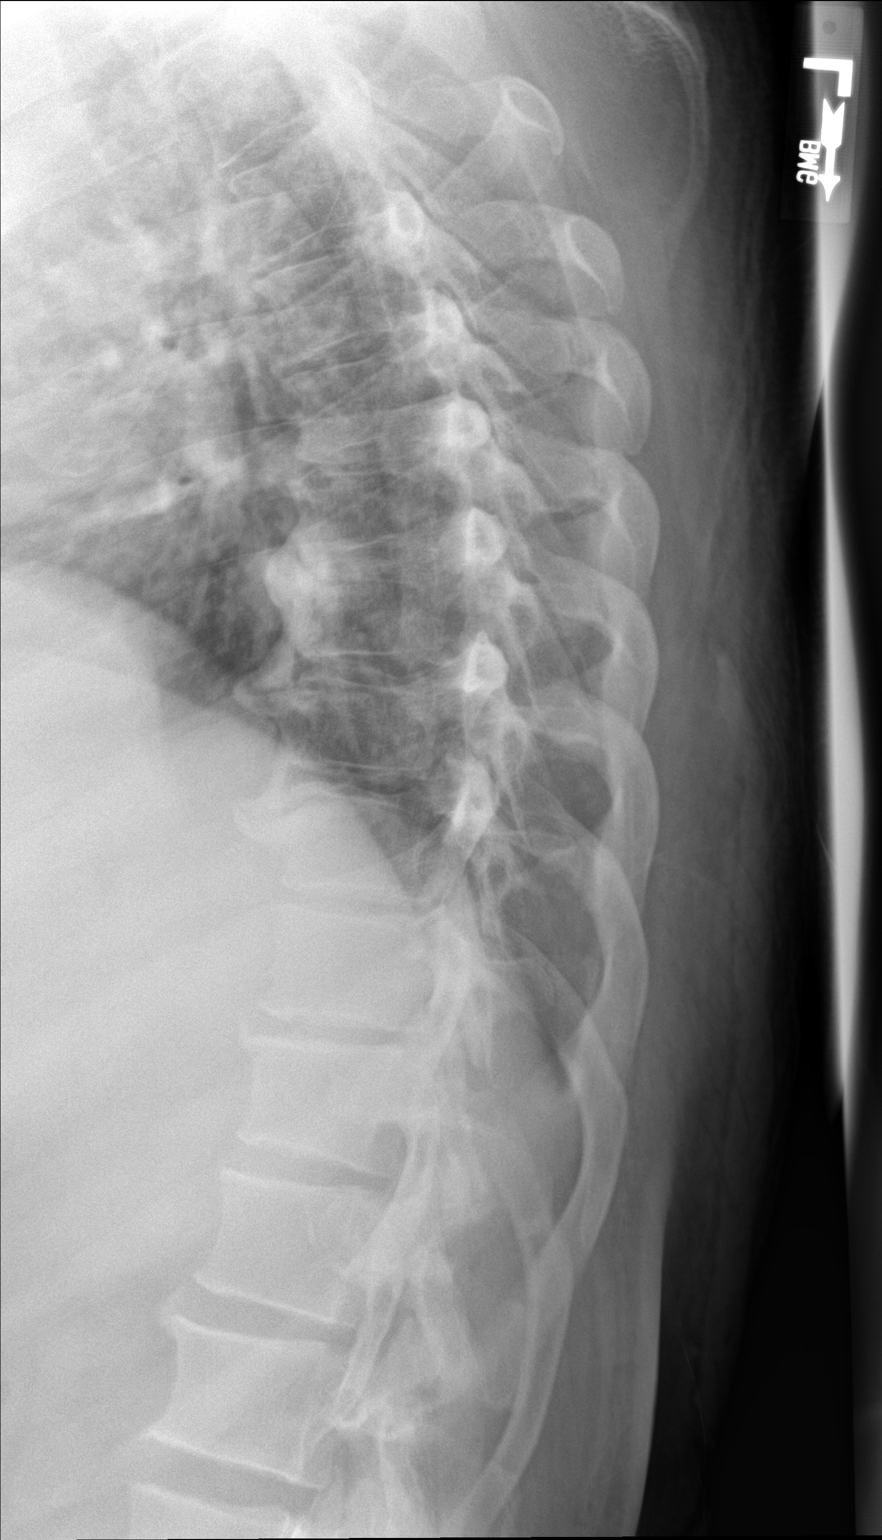

[t-spine lat (2 of 2)]
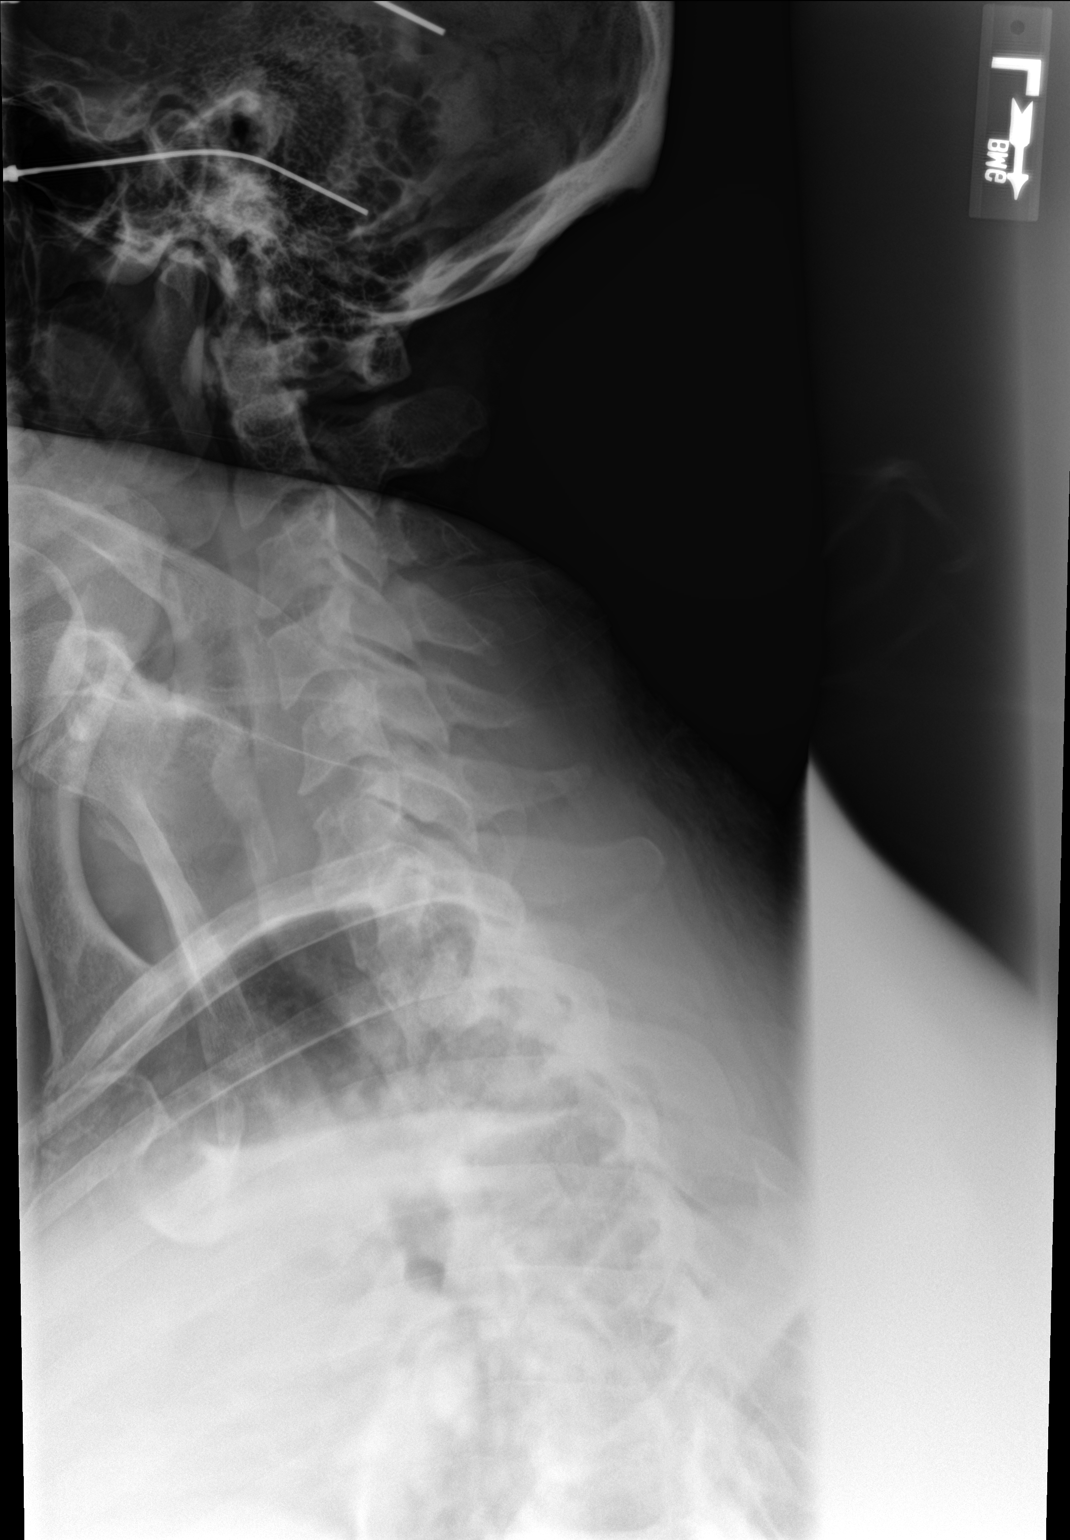

[3 of 3 positions shown; findings below may reference images not displayed]

FINDINGS: Curvature of the thoracic spine is convex towards the left. There is
multi level disc space narrowing and ventral endplate spurring
particularly within the upper and mid thoracic spine compatible with
degenerative disc disease. There is no fracture or subluxation.
IMPRESSION: 1. No acute findings.
2. Scoliosis and multi level degenerative disc disease.

## 2018-04-06 IMAGING — DX DG LUMBAR SPINE COMPLETE 4+V
5 series · 5 of 5 positions shown · non-contrast
Comparison: No priors.

CLINICAL DATA: 50-year-old male with history of chronic back pain,
worsening over the past 3 months.

EXAM:
LUMBAR SPINE - COMPLETE 4+ VIEW

[l-spine ap]
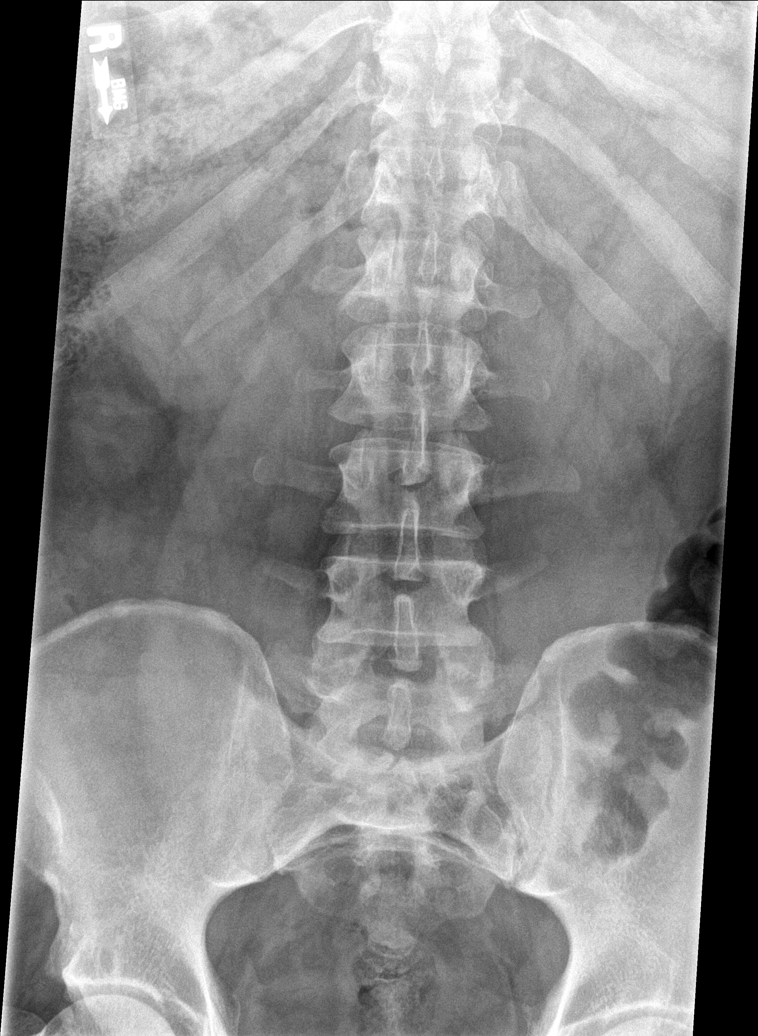

[l-spine obl (1 of 2)]
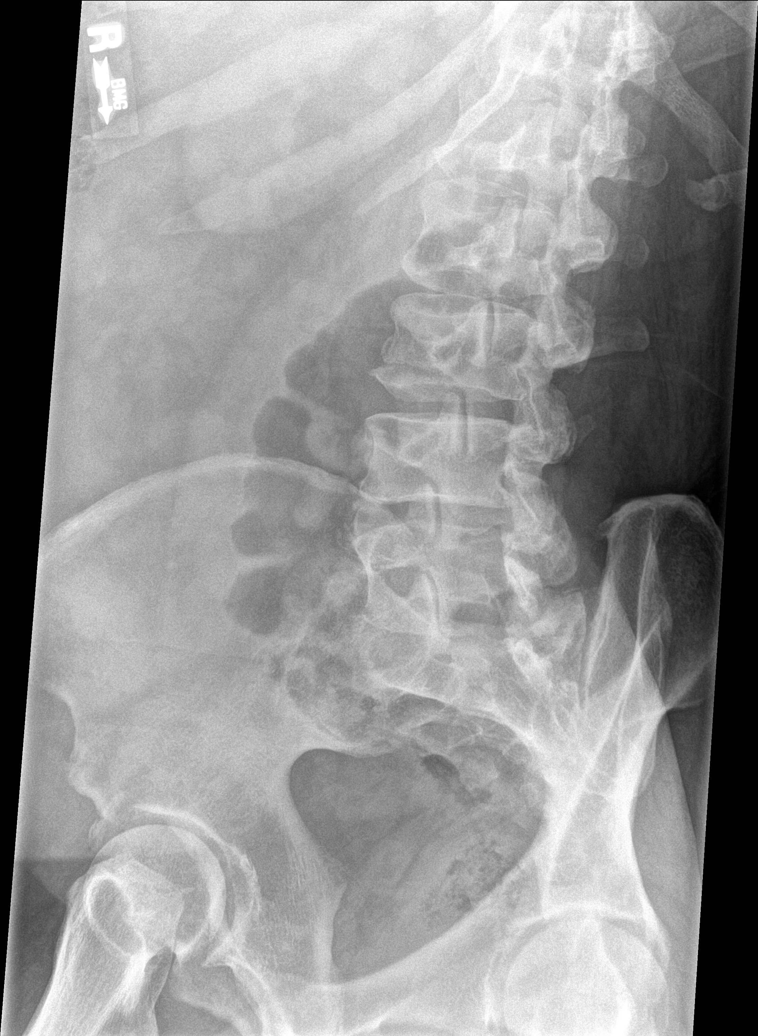

[l-spine obl (2 of 2)]
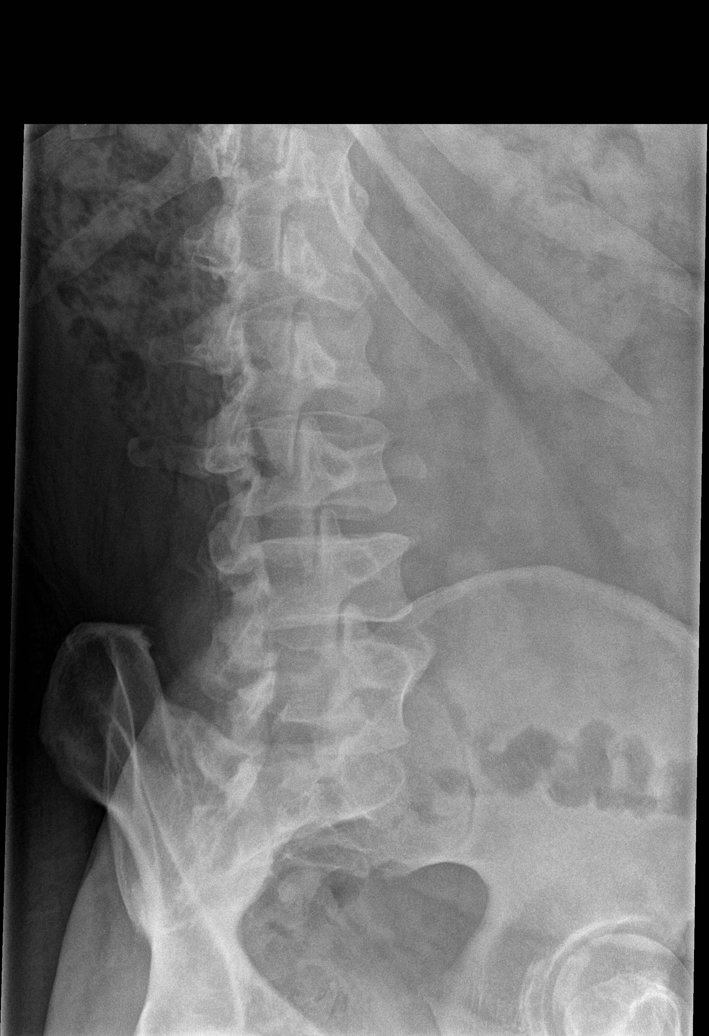

[l-spine lat]
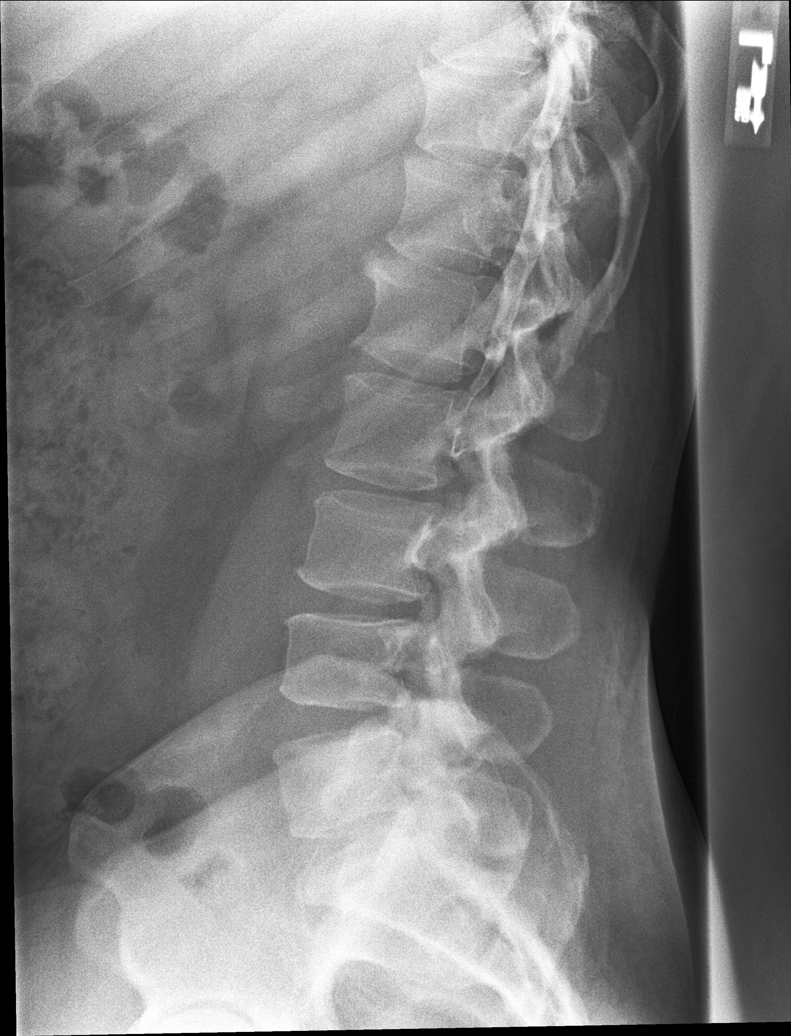

[l-spine l5-s1]
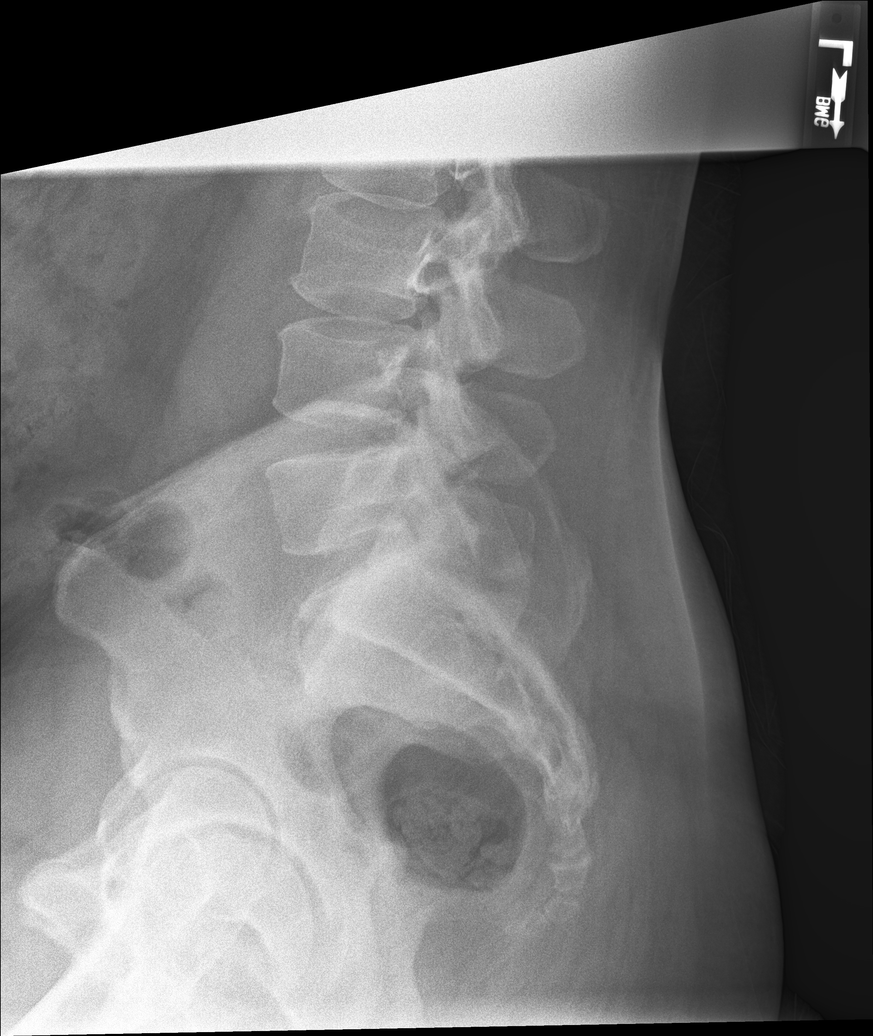

[5 of 5 positions shown; findings below may reference images not displayed]

FINDINGS: There is no evidence of lumbar spine fracture. Alignment is normal.
Mild multilevel degenerative disc disease, most evident at T12-L1
and L1-L2. Mild multilevel facet arthropathy, most pronounced at
L5-S1.
IMPRESSION: 1. Mild multilevel degenerative disc disease and lumbar spondylosis,
as above. No acute findings.

## 2022-03-29 ENCOUNTER — Other Ambulatory Visit: Payer: Self-pay

## 2022-03-29 ENCOUNTER — Emergency Department (HOSPITAL_COMMUNITY)
Admission: EM | Admit: 2022-03-29 | Discharge: 2022-03-29 | Disposition: A | Discharge status: Home / Self Care | Payer: No Typology Code available for payment source | Attending: Emergency Medicine | Admitting: Emergency Medicine

## 2022-03-29 DIAGNOSIS — Z7984 Long term (current) use of oral hypoglycemic drugs: Secondary | ICD-10-CM | POA: Diagnosis not present

## 2022-03-29 DIAGNOSIS — I83811 Varicose veins of right lower extremities with pain: Secondary | ICD-10-CM | POA: Insufficient documentation

## 2022-03-29 DIAGNOSIS — E119 Type 2 diabetes mellitus without complications: Secondary | ICD-10-CM | POA: Insufficient documentation

## 2022-03-29 DIAGNOSIS — I83891 Varicose veins of right lower extremities with other complications: Secondary | ICD-10-CM

## 2022-03-29 NOTE — ED Triage Notes (Signed)
Pt reports scratching his right leg this morning and reports lower leg started bleeding. Pt reports bleeding from a varicose vein and reports this has happened before.Bleeding is controlled with pressure dressing. Pt denies taking blood thinners and denies dizziness. Pt ambulatory with steady gait.

## 2022-03-29 NOTE — Discharge Instructions (Signed)
Follow up with vascular surgery, call to schedule an appointment. If bleeding returns- apply tight dressing, elevate extremity, recheck after 15 minutes. If unable to control bleeding, return to the ER.  Consider compression hose to help with leg swelling.

## 2022-03-29 NOTE — ED Provider Notes (Signed)
[facility]-EMERGENCY DEPT Provider Note   CSN: [id number] Arrival date & time: 03/29/22  0544     History  Chief Complaint  Patient presents with   Leg Problem    Tyrone Robicheaux Sr. is a 57 y.o. male.  57 year old male with past medical history of diabetes, hyperlipidemia presents with concern for bleeding from varicose vein.  Patient states that he barely scratched his leg and bleeding started, unable to control with dressing at home, EMS was called and a bandage was applied.  Bleeding is controlled at this point.  States this happened a few ago as well while he was in [place].  He went to the ER locally, bleeding has stopped on arrival and no further treatment was needed.  Has not been seen by vascular previously.       Home Medications Prior to Admission medications   Medication Sig Start Date End Date Taking? Authorizing Provider  atorvastatin (LIPITOR) 80 MG tablet Take 1/2 to 1 tablet daily or as directed for Cholesterol 03/05/15   Lucky Cowboy, MD  cyclobenzaprine (FLEXERIL) 5 MG tablet Take 1 tablet (5 mg total) by mouth 3 (three) times daily as needed for muscle spasms. 03/15/16   Benjiman Core D, PA-C  glucose blood (FREESTYLE TEST STRIPS) test strip Use to test blood sugar once daily. Dx code: 250.00 10/30/13   Doree Albee, PA-C  glucose monitoring kit (FREESTYLE) monitoring kit 1 each by Does not apply route as needed for other. 06/10/15   Shirleen Schirmer, PA-C  Lancets (FREESTYLE) lancets Use to test blood sugar once daily. Dx code: 250.00 03/19/13   Sondra Barges, PA-C  lisinopril (PRINIVIL,ZESTRIL) 20 MG tablet  09/27/15   [provider]  meloxicam (MOBIC) 15 MG tablet Take 1 tablet (15 mg total) by mouth daily. 03/15/16   Benjiman Core D, PA-C  metFORMIN (GLUCOPHAGE-XR) 500 MG 24 hr tablet take 2 tablets by mouth twice a day with meals (SCHEDULE APPOINTMENT FOR FURTHER REFILLS) 01/16/17   Lucky Cowboy, MD       Allergies    Zoloft [sertraline hcl]    Review of Systems   Review of Systems Negative except as per HPI Physical Exam Updated Vital Signs BP (!) 149/81 (BP Location: Right Arm)   Pulse (!) 59   Temp 97.8 F (36.6 C) (Oral)   Resp 16   Ht 6\' 2"  (1.88 m)   Wt (!) 140.6 kg   SpO2 98%   BMI 39.80 kg/m  Physical Exam Vitals and nursing note reviewed.  Constitutional:      General: He is not in acute distress.    Appearance: He is well-developed. He is not diaphoretic.  HENT:     Head: Normocephalic and atraumatic.  Pulmonary:     Effort: Pulmonary effort is normal.  Musculoskeletal:     Right lower leg: Edema present.     Left lower leg: Edema present.     Comments: Mild pitting edema bilateral lower extremities  Skin:    General: Skin is warm and dry.     Findings: No erythema or rash.  Neurological:     Mental Status: He is alert and oriented to person, place, and time.  Psychiatric:        Behavior: Behavior normal.     ED Results / Procedures / Treatments   Labs (all labs ordered are listed, but only abnormal results are displayed) Labs Reviewed - No data to display  EKG None  Radiology No results  found.  Procedures Procedures    Medications Ordered in ED Medications - No data to display  ED Course/ Medical Decision Making/ A&P                           Medical Decision Making  57 year old male with concern for bleeding from varicose vein from the anterior right lower leg.  Dressing applied by EMS, removed for exam, no longer bleeding.  Not anticoagulated.  Discussed management at home should bleeding return, referred to vascular surgery for follow-up.  Recommend compression hose.        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

## 2022-06-13 ENCOUNTER — Encounter (HOSPITAL_COMMUNITY): Payer: Self-pay

## 2022-06-27 ENCOUNTER — Other Ambulatory Visit (HOSPITAL_COMMUNITY): Payer: Self-pay

## 2022-06-27 DIAGNOSIS — I83813 Varicose veins of bilateral lower extremities with pain: Secondary | ICD-10-CM

## 2022-07-11 ENCOUNTER — Ambulatory Visit (HOSPITAL_COMMUNITY)
Admission: RE | Admit: 2022-07-11 | Discharge: 2022-07-11 | Disposition: A | Discharge status: Home / Self Care | Payer: Non-veteran care | Source: Ambulatory Visit | Attending: Cardiovascular Disease | Admitting: Cardiovascular Disease

## 2022-07-11 ENCOUNTER — Encounter (HOSPITAL_COMMUNITY): Payer: Self-pay

## 2022-07-11 ENCOUNTER — Other Ambulatory Visit (HOSPITAL_COMMUNITY): Payer: Self-pay

## 2022-07-11 ENCOUNTER — Ambulatory Visit (HOSPITAL_COMMUNITY)
Admission: RE | Admit: 2022-07-11 | Discharge: 2022-07-11 | Disposition: A | Discharge status: Home / Self Care | Payer: No Typology Code available for payment source | Source: Ambulatory Visit | Attending: Cardiovascular Disease | Admitting: Cardiovascular Disease

## 2022-07-11 ENCOUNTER — Ambulatory Visit (HOSPITAL_BASED_OUTPATIENT_CLINIC_OR_DEPARTMENT_OTHER)
Admission: RE | Admit: 2022-07-11 | Discharge: 2022-07-11 | Disposition: A | Discharge status: Home / Self Care | Payer: No Typology Code available for payment source | Source: Ambulatory Visit

## 2022-07-11 DIAGNOSIS — R600 Localized edema: Secondary | ICD-10-CM

## 2022-07-11 DIAGNOSIS — L97211 Non-pressure chronic ulcer of right calf limited to breakdown of skin: Secondary | ICD-10-CM

## 2022-07-11 DIAGNOSIS — I83813 Varicose veins of bilateral lower extremities with pain: Secondary | ICD-10-CM | POA: Insufficient documentation

## 2022-07-11 DIAGNOSIS — M7989 Other specified soft tissue disorders: Secondary | ICD-10-CM

## 2022-07-19 ENCOUNTER — Encounter (HOSPITAL_COMMUNITY): Payer: Non-veteran care

## 2022-07-19 ENCOUNTER — Inpatient Hospital Stay (HOSPITAL_COMMUNITY): Admission: RE | Admit: 2022-07-19 | Payer: Non-veteran care | Source: Ambulatory Visit

## 2022-07-19 ENCOUNTER — Ambulatory Visit (HOSPITAL_COMMUNITY)
Admission: RE | Admit: 2022-07-19 | Discharge: 2022-07-19 | Disposition: A | Discharge status: Home / Self Care | Payer: No Typology Code available for payment source | Source: Ambulatory Visit | Attending: Cardiovascular Disease | Admitting: Cardiovascular Disease

## 2022-07-19 DIAGNOSIS — L97211 Non-pressure chronic ulcer of right calf limited to breakdown of skin: Secondary | ICD-10-CM | POA: Insufficient documentation

## 2022-07-19 DIAGNOSIS — R600 Localized edema: Secondary | ICD-10-CM | POA: Diagnosis not present

## 2022-07-19 DIAGNOSIS — I83212 Varicose veins of right lower extremity with both ulcer of calf and inflammation: Secondary | ICD-10-CM | POA: Insufficient documentation

## 2022-07-19 DIAGNOSIS — M7989 Other specified soft tissue disorders: Secondary | ICD-10-CM | POA: Insufficient documentation

## 2022-10-12 ENCOUNTER — Ambulatory Visit: Payer: Non-veteran care | Admitting: Registered"

## 2022-11-15 ENCOUNTER — Encounter
Payer: No Typology Code available for payment source | Attending: Internal Medicine | Admitting: Skilled Nursing Facility1

## 2022-11-15 ENCOUNTER — Encounter: Payer: Self-pay | Admitting: Skilled Nursing Facility1

## 2022-11-15 VITALS — Wt 310.0 lb

## 2022-11-15 DIAGNOSIS — E119 Type 2 diabetes mellitus without complications: Secondary | ICD-10-CM | POA: Diagnosis present

## 2022-11-15 NOTE — Progress Notes (Unsigned)
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  Pt states he checks his blood sugars 1 time a week.  Pt states e really does not like to cook.    Estimated daily fluid intake:  oz Supplements:  Sleep:  Stress / self-care:  Current average weekly physical activity: ADL's  24-Hr Dietary Recall: goes to bed around 6-8am; wakes around 2pm-5pm First Meal 2pm:  sausage or pizza or cookies or granola bar + pack of crackers Snack:  Second Meal: maybe fried chicken and avacado  Snack: cookies Third Meal: maybe ramen noodles Snack:  Beverages: water, juice, soda   NUTRITION INTERVENTION  Nutrition education (E-1) on the following topics:  Creation of balanced and diverse meals to increase the intake of nutrient-rich foods that provide essential vitamins, minerals, fiber, and phytonutrients Variety of Fruits and Vegetables: Aim for a colorful array of fruits and vegetables to ensure a wide range of nutrients. Include a mix of leafy greens, berries, citrus fruits, cruciferous vegetables, and more. Whole Grains: Choose whole grains over refined grains. Examples include brown rice, quinoa, oats, whole wheat, and barley. Lean Proteins: Include lean sources of protein, such as poultry, fish, tofu, legumes, beans, lentils, and low-fat dairy products. Limit red and processed meats. Healthy Fats: Incorporate sources of healthy fats, including avocados, nuts, seeds, and olive oil. Limit saturated and trans fats found in fried and processed foods. Dairy or Dairy Alternatives: Choose low-fat or fat-free dairy products,  or plant-based alternatives like almond or soy milk. Portion Control: Be mindful of portion sizes to avoid overeating. Pay attention to hunger and satisfaction cues. Limit Added Sugars: Minimize the consumption of sugary beverages, snacks, and desserts. Check food labels for added sugars and opt for natural sources of sweetness such as whole fruits. Hydration: Drink plenty of water throughout the day. Limit sugary drinks and excessive caffeine intake. Moderate Sodium Intake: Reduce the consumption of high-sodium foods. Use herbs and spices for flavor instead of excessive salt. Meal Planning and Preparation: Plan and prepare meals ahead of time to make healthier choices more convenient. Include a mix of food groups in each meal. Limit Processed Foods: Minimize the intake of highly processed and packaged foods that are often high in added sugars, salt, and unhealthy fats. Regular Physical Activity: Combine a healthy diet with regular physical activity for overall well-being. Aim for at least 150 minutes of moderate-intensity aerobic exercise per week, along with strength training. Moderation and Balance: Enjoy treats and indulgent foods in moderation, emphasizing balance rather than strict restriction.  Handouts Provided Include  Detailed MyPlate  Learning Style & Readiness for Change Teaching method utilized: Visual & Auditory  Demonstrated degree of understanding via: Teach Back  Barriers to learning/adherence to lifestyle change: pre contemplative   Goals Established by Pt ***   MONITORING & EVALUATION Dietary intake, weekly physical activity  Next Steps  Patient is to ***.

## 2023-02-21 ENCOUNTER — Telehealth (HOSPITAL_COMMUNITY): Payer: Self-pay | Admitting: *Deleted

## 2023-02-21 ENCOUNTER — Ambulatory Visit (HOSPITAL_COMMUNITY)
Admission: EM | Admit: 2023-02-21 | Discharge: 2023-02-21 | Disposition: A | Discharge status: Home / Self Care | Payer: No Typology Code available for payment source

## 2023-02-21 ENCOUNTER — Encounter (HOSPITAL_COMMUNITY): Payer: Self-pay

## 2023-02-21 ENCOUNTER — Ambulatory Visit (HOSPITAL_COMMUNITY): Payer: Non-veteran care | Attending: Internal Medicine

## 2023-02-21 DIAGNOSIS — S93402A Sprain of unspecified ligament of left ankle, initial encounter: Secondary | ICD-10-CM

## 2023-02-21 DIAGNOSIS — S46011A Strain of muscle(s) and tendon(s) of the rotator cuff of right shoulder, initial encounter: Secondary | ICD-10-CM

## 2023-02-21 DIAGNOSIS — S7011XA Contusion of right thigh, initial encounter: Secondary | ICD-10-CM

## 2023-02-21 MED ORDER — KETOROLAC TROMETHAMINE 30 MG/ML IJ SOLN
30.0000 mg | Freq: Once | INTRAMUSCULAR | Status: AC
  Administered 2023-02-21: 30 mg via INTRAMUSCULAR

## 2023-02-21 MED ORDER — TRAMADOL HCL 50 MG PO TABS: 50.0000 mg | ORAL_TABLET | Freq: Three times a day (TID) | ORAL | 0 refills | Status: AC | PRN

## 2023-02-21 NOTE — Discharge Instructions (Addendum)
Continue home medication for pain - 800mg  Motrin 3 times daily,  take with food.  Do NOT start motrin tonight because you have received a ketoralac shot that is same type of medication.  Tramadol 50mg   every 8 hours as needed for pain.  Safe to take with motrin.    Call [facility] - [phone]    Topical ointment of choice to areas of concern.  Shoulder immobilizer until cleared by ortho to rule out fracture Ace Wrap to left ankle for support.    Ice to shoulder and ankle 3 times a day for 20 minutes each.    Final radiology report is not back - we will call with any concerning findings or fractures.

## 2023-02-21 NOTE — ED Provider Notes (Signed)
[facility]    CSN: [id number] Arrival date & time: 02/21/23  1703      History   Chief Complaint Chief Complaint  Patient presents with   Fall    HPI Tyrone [facility] is a 57 y.o. male.   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  He is reporting right shoulder pain with difficulty with range of motion, lifting arm, twisting arm.   He is also reporting left ankle pain which is increasing with weightbearing.  Patient is also reporting swelling of his right thigh but he has full range of motion.  He did apply topical ointment to areas of concern and took Motrin without effectiveness.   The history is provided by the patient.  Fall This is a new problem. The current episode started 12 to 24 hours ago. The problem occurs constantly.    Past Medical History:  Diagnosis Date   Depression    Diabetes mellitus without complication (HCC)    Hypogonadism male    OCD (obsessive compulsive disorder)    Vitamin D deficiency     Patient Active Problem List   Diagnosis Date Noted   Poor compliance 12/07/2016   Diabetes mellitus without complication (HCC) 10/06/2015   OAB (overactive bladder) 10/06/2015   Medication management 08/26/2014   Elevated BP 02/14/2014   Testosterone deficiency 02/14/2014   Erectile dysfunction 10/30/2013   Mixed hyperlipidemia 10/30/2013   Morbid Obesity (BMI 38.77) 10/30/2013   Vitamin D deficiency     History reviewed. No pertinent surgical history.     Home Medications    Prior to Admission medications   Medication Sig Start Date End Date Taking? Authorizing Provider  empagliflozin (JARDIANCE) 25 MG TABS tablet Take 25 mg by mouth daily. 01/25/23  Yes [provider]  gabapentin (NEURONTIN) 600 MG tablet Take 600 mg by mouth at bedtime. 08/22/22  Yes [provider]  insulin  glargine-yfgn (SEMGLEE) 100 UNIT/ML Pen Inject into the skin at bedtime. 01/25/23  Yes [provider]  oxybutynin (DITROPAN-XL) 10 MG 24 hr tablet Take 1 tablet by mouth at bedtime. 02/21/22  Yes [provider]  pioglitazone (ACTOS) 45 MG tablet Take 45 mg by mouth daily. 01/25/23  Yes [provider]  Semaglutide,0.25 or 0.5MG /DOS, 2 MG/3ML SOPN Inject into the skin. 01/26/23  Yes [provider]  sildenafil (VIAGRA) 100 MG tablet Take 100 mg by mouth as needed. 02/21/22  Yes [provider]  tamsulosin (FLOMAX) 0.4 MG CAPS capsule Take 0.4 mg by mouth daily after supper. 02/21/22  Yes [provider]  traMADol (ULTRAM) 50 MG tablet Take 1 tablet (50 mg total) by mouth every 8 (eight) hours as needed. 02/21/23  Yes Broadus Costilla, Linde Gillis, NP  atorvastatin (LIPITOR) 80 MG tablet Take 1/2 to 1 tablet daily or as directed for Cholesterol 03/05/15   Lucky Cowboy, MD  glucose blood (FREESTYLE TEST STRIPS) test strip Use to test blood sugar once daily. Dx code: 250.00 10/30/13   Doree Albee, PA-C  glucose monitoring kit (FREESTYLE) monitoring kit 1 each by Does not apply route as needed for other. 06/10/15   Shirleen Schirmer, PA-C  Lancets (FREESTYLE) lancets Use to test blood sugar once daily. Dx code: 250.00 03/19/13   Sondra Barges, PA-C    Family History Family History  Problem Relation Age of Onset  Hypertension Mother    Hyperlipidemia Mother    Hypertension Father    Diabetes Father    Hypertension Brother    Diabetes Brother    Cancer Maternal Grandfather    Cancer Maternal Uncle     Social History Social History   Tobacco Use   Smoking status: Never   Smokeless tobacco: Never  Substance Use Topics   Alcohol use: No   Drug use: No     Allergies   Zoloft [sertraline hcl]   Review of Systems Review of Systems  Musculoskeletal:  Positive for gait problem, joint swelling and myalgias.       He is reporting right shoulder  pain with decreased range of motion.  Left ankle pain with decreased weightbearing.  Left thigh pain with minimal swelling.  All other systems reviewed and are negative.    Physical Exam Triage Vital Signs ED Triage Vitals [02/21/23 1808]  Encounter Vitals Group     BP (!) 158/76     Systolic BP Percentile      Diastolic BP Percentile      Pulse Rate 88     Resp 16     Temp 97.8 F (36.6 C)     Temp Source Oral     SpO2 99 %     Weight      Height      Head Circumference      Peak Flow      Pain Score      Pain Loc      Pain Education      Exclude from Growth Chart    No data found.  Updated Vital Signs BP (!) 158/76 (BP Location: Right Arm)   Pulse 88   Temp 97.8 F (36.6 C) (Oral)   Resp 16   SpO2 99%   Visual Acuity Right Eye Distance:   Left Eye Distance:   Bilateral Distance:    Right Eye Near:   Left Eye Near:    Bilateral Near:     Physical Exam Vitals and nursing note reviewed.  Musculoskeletal:        General: Swelling, tenderness and signs of injury present.     Comments: Patient has area of contusion to right thigh with full ROM of hip and knee.  He is weight bearing with minimal discomfort.  Swelling to the left medial ankle and plantar surface of the left foot, decreased ROM and increased pain on weight bearing.  Right shoulder with pain on palpation at the acromial process and deltoid muscle.  He has positive can sign, decreased ROM on abduction, decreased external rotation.       UC Treatments / Results  Labs (all labs ordered are listed, but only abnormal results are displayed) Labs Reviewed - No data to display  EKG   Radiology DG Ankle Complete Left  Result Date: 02/21/2023 CLINICAL DATA:  Left foot pain after a fall yesterday. EXAM: LEFT ANKLE COMPLETE - 3+ VIEW; LEFT FOOT - 2 VIEW COMPARISON:  None Available. FINDINGS: Three views of the left ankle and two views of the left foot are obtained. Degenerative changes in the left  ankle and intertarsal joints with old ununited ossicles inferior to the distal fibula. Small plantar and Achilles calcaneal spurs. 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 No evidence of acute fracture or dislocation in the foot or ankle. No focal bone lesion or bone destruction. Soft tissues are unremarkable. IMPRESSION: Degenerative changes in the foot and ankle. 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 No acute bony abnormalities. Electronically Signed   By: Burman Nieves M.D.   On: 02/21/2023 20:39   DG Foot 2 Views Left  Result Date: 02/21/2023 CLINICAL DATA:  Left foot pain after a fall yesterday. EXAM: LEFT ANKLE COMPLETE - 3+ VIEW; LEFT FOOT - 2 VIEW COMPARISON:  None Available. FINDINGS: Three views of the left ankle and two views of the left foot are obtained. Degenerative changes in the left ankle and intertarsal joints with old ununited ossicles inferior to the distal fibula. Small plantar and Achilles calcaneal spurs. 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 No evidence of acute fracture or dislocation in the foot or ankle. No focal bone lesion or bone destruction. Soft tissues are unremarkable. IMPRESSION: Degenerative changes in the foot and ankle. 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 No acute bony abnormalities. Electronically Signed   By: Burman Nieves M.D.   On: 02/21/2023 20:39   DG Shoulder Right  Result Date: 02/21/2023 CLINICAL DATA:  Right shoulder pain with movement after a fall. EXAM: RIGHT SHOULDER - 2+ VIEW COMPARISON:  None Available. FINDINGS: There is a corticated ununited ossicle off of the superior glenoid which probably represents an old fracture fragment. Cortical depression at the lateral humeral head likely representing old Hill-Sachs deformity. Correlate for any prior history of shoulder dislocations. No evidence of acute fracture or dislocation. Coracoclavicular and acromioclavicular spaces are normal. Mild degenerative changes  in the acromioclavicular and glenohumeral joints. Soft tissues are unremarkable. IMPRESSION: Old fracture deformity suggested off of the superior glenoid and lateral humeral head. No acute bony abnormalities. Mild degenerative changes. Electronically Signed   By: Burman Nieves M.D.   On: 02/21/2023 20:37    Procedures Procedures (including critical care time)  Medications Ordered in UC Medications  ketorolac (TORADOL) 30 MG/ML injection 30 mg (30 mg Intramuscular Given 02/21/23 2037)    Initial Impression / Assessment and Plan / UC Course  I have reviewed the triage vital signs and the nursing notes.  Pertinent labs & imaging results that were available during my care of the patient were reviewed by me and considered in my medical decision making (see chart for details).   Work injury.  Patient reporting injury to the right shoulder.  He has symptoms that are consistent with a possible right rotator cuff injury vs shoulder strain.   Left ankle is swollen consistent with sprain. X-rays taken all negative for acute fractures Ace wrap provided to left ankle for support Shoulder splint give and recommend usage until cleared by ortho for possible rotator cuff injury.  He should go to orthopedics for further workup.  He will need to see [facility] tomorrow for appointment related to workman comp.     Final Clinical Impressions(s) / UC Diagnoses   Final diagnoses:  Strain of musc/tend the rotator cuff of right shoulder, init  Moderate left ankle sprain, initial encounter  Contusion of right thigh, initial encounter     Discharge Instructions      Continue home medication for pain - 800mg  Motrin 3 times daily,  take with food.  Do NOT start motrin tonight because you have received a ketoralac shot that is same type of medication.  Tramadol 50mg   every 8 hours as needed for pain.  Safe to take with motrin.    Call [facility] -  [phone]    Topical ointment of choice to areas of concern.  Shoulder immobilizer until cleared by ortho  to rule out fracture Ace Wrap to left ankle for support.    Ice to shoulder and ankle 3 times a day for 20 minutes each.    Final radiology report is not back - we will call with any concerning findings or fractures.        ED Prescriptions     Medication Sig Dispense Auth. Provider   traMADol (ULTRAM) 50 MG tablet Take 1 tablet (50 mg total) by mouth every 8 (eight) hours as needed. 15 tablet Madix Blowe, Linde Gillis, NP      I have reviewed the PDMP during this encounter.   Nelda Marseille, NP 02/21/23 [phone]

## 2023-02-21 NOTE — ED Triage Notes (Signed)
Patient here today with c/o left foot pain, right shoulder pain, right thigh pain, and right hand pain after falling at work yesterday. 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
